# Patient Record
Sex: Female | Born: 1990 | Race: Black or African American | Hispanic: No | Marital: Married | Smoking: Never smoker
Health system: Southern US, Community
[De-identification: ages and names within clinical notes are randomized; demographics above are authoritative.]

## PROBLEM LIST (undated history)

## (undated) ENCOUNTER — Inpatient Hospital Stay (HOSPITAL_COMMUNITY): Payer: Self-pay

## (undated) DIAGNOSIS — R519 Headache, unspecified: Secondary | ICD-10-CM

## (undated) DIAGNOSIS — R51 Headache: Secondary | ICD-10-CM

## (undated) HISTORY — DX: Headache: R51

## (undated) HISTORY — DX: Headache, unspecified: R51.9

---
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
## 2010-03-23 ENCOUNTER — Inpatient Hospital Stay (HOSPITAL_COMMUNITY)
Admission: AD | Admit: 2010-03-23 | Discharge: 2010-03-26 | DRG: 765 | Disposition: A | Discharge status: Home / Self Care | Payer: Commercial Indemnity | Source: Ambulatory Visit | Attending: Obstetrics and Gynecology | Admitting: Obstetrics and Gynecology

## 2010-03-23 ENCOUNTER — Other Ambulatory Visit (HOSPITAL_COMMUNITY): Payer: Self-pay | Admitting: Obstetrics and Gynecology

## 2010-03-23 DIAGNOSIS — O411090 Infection of amniotic sac and membranes, unspecified, unspecified trimester, not applicable or unspecified: Secondary | ICD-10-CM | POA: Diagnosis present

## 2010-03-23 DIAGNOSIS — O324XX0 Maternal care for high head at term, not applicable or unspecified: Secondary | ICD-10-CM | POA: Diagnosis present

## 2010-03-23 LAB — CBC
MCH: 29.6 pg (ref 26.0–34.0)
MCHC: 34.6 g/dL (ref 30.0–36.0)
MCV: 85.7 fL (ref 78.0–100.0)
Platelets: 220 10*3/uL (ref 150–400)
RDW: 14 % (ref 11.5–15.5)

## 2010-03-23 LAB — RPR: RPR Ser Ql: NONREACTIVE

## 2010-03-24 LAB — CBC
Hemoglobin: 9.3 g/dL — ABNORMAL LOW (ref 12.0–15.0)
MCH: 29.6 pg (ref 26.0–34.0)
MCHC: 33.3 g/dL (ref 30.0–36.0)
Platelets: 176 10*3/uL (ref 150–400)
RDW: 14.3 % (ref 11.5–15.5)

## 2010-03-28 ENCOUNTER — Inpatient Hospital Stay (HOSPITAL_COMMUNITY)
Admission: AD | Admit: 2010-03-28 | Discharge: 2010-03-28 | Disposition: A | Discharge status: Home / Self Care | Payer: Commercial Indemnity | Source: Ambulatory Visit | Attending: Obstetrics and Gynecology | Admitting: Obstetrics and Gynecology

## 2010-03-28 DIAGNOSIS — O9122 Nonpurulent mastitis associated with the puerperium: Secondary | ICD-10-CM | POA: Insufficient documentation

## 2010-03-28 DIAGNOSIS — O864 Pyrexia of unknown origin following delivery: Secondary | ICD-10-CM | POA: Insufficient documentation

## 2010-03-28 LAB — URINALYSIS, ROUTINE W REFLEX MICROSCOPIC
Ketones, ur: NEGATIVE mg/dL
Protein, ur: 30 mg/dL — AB
Urine Glucose, Fasting: NEGATIVE mg/dL
Urobilinogen, UA: 0.2 mg/dL (ref 0.0–1.0)

## 2010-03-28 LAB — URINE MICROSCOPIC-ADD ON

## 2010-03-31 LAB — URINE CULTURE
Colony Count: >=100000
Culture  Setup Time: 201202070450

## 2010-04-03 NOTE — Op Note (Signed)
[name], [name]               ACCOUNT NO.:  [id number]  MEDICAL RECORD NO.:  [id number]           PATIENT TYPE:  I  LOCATION:  9138                          FACILITY:  [facility]  PHYSICIAN:  Zelphia Cairo, MD    DATE OF BIRTH:  November 07, 1990  DATE OF PROCEDURE:  03/23/2010 DATE OF DISCHARGE:                              OPERATIVE REPORT   PREOPERATIVE DIAGNOSES: 1. Intrauterine pregnancy at 39 plus weeks. 2. Chorioamnionitis. 3. Failure to descent.  PROCEDURE:  Primary low transverse cesarean delivery.  SURGEON:  Zelphia Cairo, MD  ANESTHESIA:  Epidural.  FINDINGS:  Viable female infant with Apgars of 6 and 9, pH 7.249.  SPECIMEN:  Placenta to Pathology.  URINE OUTPUT:  Clear.  ESTIMATED BLOOD LOSS:  700 mL.  COMPLICATIONS:  None.  CONDITION:  Stable to recovery room.  PROCEDURE IN DETAILS:  Tanya [name] was taken to the operating room with Foley catheter running and placed in the supine position with a left tilt.  Fetal heart tones were noted to be 180.  However, during dosing of her epidural and prepping for surgery, fetal heart tones decreased to the 80s.  At this point she was quickly draped.  An Allis test was negative and a Pfannenstiel skin incision was made with a scalpel and carried down to the underlying fascia.  The fascia was incised in the midline and this was extended laterally using curved Mayo scissors. Kocher clamps were used to grasp the superior and inferior portion of the fascia.  The fascia was tented upwards and the underlying rectus muscles were dissected off using curved Mayo scissors.  Peritoneum was entered sharply and this was extended bluntly.  The bladder blade was inserted and the vesicouterine peritoneum was dissected off the lower uterine segment using blunt dissection.  The bladder blade was repositioned.  Uterine incision was made with a scalpel and extended bluntly using my hands.  The head was deep posterior and extended within the  pelvis.  At this point I asked for a vaginal hand and a step stool.  A very little help was given with the vaginal hand by a nursing student and therefore Dr. Tamela Oddi was called into the room to assist with delivery.  When she arrived, I went below the table, used a cupped palm to gently push the baby's head up into the pelvis and she was able to flex the head and deliver through the uterine incision.  The shoulders and body easily followed with fundal pressure.  The cord was clamped and cut as the mouth and nose were suctioned and the infant was quickly taken to the awaiting pediatric staff.  The placenta was then manually removed from the uterus.  The uterus was cleared of all clots and debris using a dry lap sponge and the uterine incision was closed using double-layer closure of 0 chromic in a running locked fashion.  Once hemostasis was assured, the pelvis was copiously irrigated with warm normal saline.  Uterine incision was reinspected and again found to be hemostatic.  The peritoneum was closed with 0 Monocryl.  The fascia was closed with a looped 0 PDS  and the skin was closed with staples.  Sponge, lap, instrument and needle counts were correct x2.  She was taken to the recovery room in stable condition.     Zelphia Cairo, MD     GA/MEDQ  D:  03/23/2010  T:  03/24/2010  Job:  [id number]  Electronically Signed by Zelphia Cairo MD on 03/31/2010 01:28:10 PM

## 2010-04-13 NOTE — Discharge Summary (Signed)
Tanya [name], PERDUE NO.:  [id number]  MEDICAL RECORD NO.:  [id number]           PATIENT TYPE:  I  LOCATION:  9138                          [facility]ACILITY:  [facility]  PHYSICIAN:  Juluis Mire, M.D.   DATE OF BIRTH:  Apr 12, 1990  DATE OF ADMISSION:  03/23/2010 DATE OF DISCHARGE:  03/26/2010                              DISCHARGE SUMMARY   ADMITTING DIAGNOSES: 1. Intrauterine pregnancy at 39-2/7 weeks' estimated gestational age. 2. Spontaneous onset of labor.  DISCHARGE DIAGNOSES: 1. Status post low transverse cesarean section secondary to failure to     descent. 2. Viable female infant.  PROCEDURE:  Primary low transverse cesarean section.  REASON FOR ADMISSION:  Please see written H and P.  HOSPITAL COURSE:  The patient is a 20 year old gravida 1, para 0 that was admitted to [facility] at 39-2/7 weeks' estimated gestational age with spontaneous onset of labor.  On admission, vital signs were stable.  Fetal heart tones were reactive.  Contractions were noted to be approximately every 2-8 minutes.  The patient was examined and found to be 4-5 cm dilated, 70% effaced, vertex at -1 station. Epidural was placed for her comfort.  Over the next several hours, vital signs were stable.  However, later in the evening, temperature was 102. Fetal heart tones were 160-170s with good beat-to-beat variability.  The patient's cervix was examined and they completely dilated, 100% effaced at a +1 station.  The patient was started on Tylenol, ampicillin, and gentamicin.  Over the next several hours, the patient continued to push with little gain in station.  Caput was noted to be forming.  Decision was made to proceed with a primary low transverse cesarean section secondary to failure to descent and chorioamnionitis.  The patient was then transferred to the operating room where epidural was dosed to an adequate surgical level.  A low transverse incision was made  with delivery of a viable female infant, weighing 7 pounds 15 ounces with Apgars of 9 at 1 minute and 9 at 5 minutes.  The patient tolerated the procedure well and was taken to the recovery room in stable condition. On postoperative day #1, the patient was without complaint.  Vital signs were stable.  Fundus was firm and nontender.  Abdominal dressing was noted to be clean, dry, and intact.  On postoperative day #2, the patient was without complaint.  Vital signs were stable.  She has been afebrile greater than 24 hours.  Abdomen was soft.  Fundus was firm and nontender.  Abdominal dressing was noted to have small amount of old drainage noted on the bandage.  She was voiding well.  Laboratory findings showed hemoglobin of 9.3 and WBC count was 14.1.  IV antibiotics were discontinued and the patient was changed to oral Ceftin 500 mg 1 p.o. b.i.d. and CBC was ordered for the following morning.  On postoperative day #3, the patient was without complaint.  Vital signs were stable.  Fundus was firm and nontender.  Incision was clean, dry, and intact.  Discharge instructions were reviewed and the patient was later discharged home.  CONDITION ON  DISCHARGE:  Stable.  DIET:  Regular as tolerated.  ACTIVITY:  No heavy lifting, no driving x2 weeks, and no vaginal entry.  FOLLOWUP:  The patient is to follow up in the office in 1 week for an incision check.  She is to call for temperature greater than 100 degrees, persistent nausea, vomiting, heavy vaginal bleeding, and/or redness or drainage from an incisional site.  DISCHARGE MEDICATIONS: 1. Tylox #30 one p.o. every 4-6 hours p.r.n. 2. Keflex 500 mg 1 p.o. q.i.d. 3. Prenatal vitamins 1 p.o. daily. 4. Colace 1 p.o. daily p.r.n.     Julio Sicks, N.P.   ______________________________ Juluis Mire, M.D.    CC/MEDQ  D:  04/04/2010  T:  04/05/2010  Job:  [id number]  Electronically Signed by Julio Sicks N.P. on 04/06/2010 08:46:20  AM Electronically Signed by Richardean Chimera M.D. on 04/13/2010 07:46:16 AM

## 2010-05-06 ENCOUNTER — Other Ambulatory Visit: Payer: Self-pay | Admitting: Obstetrics and Gynecology

## 2010-05-06 DIAGNOSIS — N6310 Unspecified lump in the right breast, unspecified quadrant: Secondary | ICD-10-CM

## 2010-05-10 ENCOUNTER — Ambulatory Visit
Admission: RE | Admit: 2010-05-10 | Discharge: 2010-05-10 | Disposition: A | Discharge status: Home / Self Care | Payer: Commercial Indemnity | Source: Ambulatory Visit | Attending: Obstetrics and Gynecology | Admitting: Obstetrics and Gynecology

## 2010-05-10 DIAGNOSIS — N6310 Unspecified lump in the right breast, unspecified quadrant: Secondary | ICD-10-CM

## 2010-06-21 ENCOUNTER — Inpatient Hospital Stay (HOSPITAL_COMMUNITY): Admission: AD | Admit: 2010-06-21 | Payer: Self-pay | Source: Home / Self Care | Admitting: Obstetrics and Gynecology

## 2014-06-09 ENCOUNTER — Other Ambulatory Visit: Payer: Self-pay | Admitting: Obstetrics and Gynecology

## 2014-06-10 LAB — CYTOLOGY - PAP

## 2014-08-19 ENCOUNTER — Emergency Department (HOSPITAL_COMMUNITY)
Admission: EM | Admit: 2014-08-19 | Discharge: 2014-08-19 | Disposition: A | Discharge status: Home / Self Care | Payer: BLUE CROSS/BLUE SHIELD | Source: Home / Self Care | Attending: Family Medicine | Admitting: Family Medicine

## 2014-08-19 ENCOUNTER — Encounter (HOSPITAL_COMMUNITY): Payer: Self-pay | Admitting: Emergency Medicine

## 2014-08-19 DIAGNOSIS — K047 Periapical abscess without sinus: Secondary | ICD-10-CM | POA: Diagnosis not present

## 2014-08-19 MED ORDER — TRAMADOL HCL 50 MG PO TABS
50.0000 mg | ORAL_TABLET | Freq: Four times a day (QID) | ORAL | Status: DC | PRN
Start: 2014-08-19 — End: 2015-07-26

## 2014-08-19 MED ORDER — AMOXICILLIN 500 MG PO CAPS: 500.0000 mg | ORAL_CAPSULE | Freq: Three times a day (TID) | ORAL | Status: DC

## 2014-08-19 MED ORDER — NAPROXEN 500 MG PO TABS: 500.0000 mg | ORAL_TABLET | Freq: Two times a day (BID) | ORAL | Status: DC

## 2014-08-19 NOTE — ED Provider Notes (Signed)
Tanya [name] is a 24 y.o. female who presents to Urgent Care today for left lower dental pain and swelling present for 2 days. No fevers chills nausea vomiting or diarrhea. She has tried Advil which has not helped. She has a dentist appointment on Friday. No chest pains palpitations or shortness of breath.   History reviewed. No pertinent past medical history. History reviewed. No pertinent past surgical history. History  Substance Use Topics  . Smoking status: Never Smoker   . Smokeless tobacco: Not on file  . Alcohol Use: Yes   ROS as above Medications: No current facility-administered medications for this encounter.   Current Outpatient Prescriptions  Medication Sig Dispense Refill  . amoxicillin (AMOXIL) 500 MG capsule Take 1 capsule (500 mg total) by mouth 3 (three) times daily. 21 capsule 0  . naproxen (NAPROSYN) 500 MG tablet Take 1 tablet (500 mg total) by mouth 2 (two) times daily. 30 tablet 0  . traMADol (ULTRAM) 50 MG tablet Take 1 tablet (50 mg total) by mouth every 6 (six) hours as needed. 15 tablet 0   No Known Allergies   Exam:  BP 109/50 mmHg  Pulse 58  Temp(Src) 98.4 F (36.9 C) (Oral)  Resp 16  SpO2 100% Gen: Well NAD HEENT: EOMI,  MMM left lower jaw swelling and tenderness. Missing tooth #18 with gum erythematous and tender. Tooth 17 is partially emerging and also tender. No visible abscess. Lungs: Normal work of breathing. CTABL Heart: RRR no MRG Abd: NABS, Soft. Nondistended, Nontender Exts: Brisk capillary refill, warm and well perfused.   No results found for this or any previous visit (from the past 24 hour(s)). No results found.  Assessment and Plan: 24 y.o. female with dental infection and pain. Treat with amoxicillin naproxen and tramadol. Follow-up with dentist.  Discussed warning signs or symptoms. Please see discharge instructions. Patient expresses understanding.     Rodolph BongEvan S Tambra Muller, MD 08/19/14 [phone]

## 2014-08-19 NOTE — ED Notes (Signed)
C/o left lower dental pain onset Monday Alert, no signs of acute distress.

## 2014-08-19 NOTE — Discharge Instructions (Signed)
Thank you for coming in today. Follow up with the dentist.  Naproxen will help with pain and will not make you sleepy.  Use tramadol for severe pain. 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 Take amoxicillin 3 times daily to treat infection.   Dental Abscess A dental abscess is a collection of infected fluid (pus) from a bacterial infection in the inner part of the tooth (pulp). It usually occurs at the end of the tooth's root.  CAUSES   Severe tooth decay.  Trauma to the tooth that allows bacteria to enter into the pulp, such as a broken or chipped tooth. SYMPTOMS   Severe pain in and around the infected tooth.  Swelling and redness around the abscessed tooth or in the mouth or face.  Tenderness.  Pus drainage.  Bad breath.  Bitter taste in the mouth.  Difficulty swallowing.  Difficulty opening the mouth.  Nausea.  Vomiting.  Chills.  Swollen neck glands. DIAGNOSIS   A medical and dental history will be taken.  An examination will be performed by tapping on the abscessed tooth.  X-rays may be taken of the tooth to identify the abscess. TREATMENT The goal of treatment is to eliminate the infection. You may be prescribed antibiotic medicine to stop the infection from spreading. A root canal may be performed to save the tooth. If the tooth cannot be saved, it may be pulled (extracted) and the abscess may be drained.  HOME CARE INSTRUCTIONS  Only take over-the-counter or prescription medicines for pain, fever, or discomfort as directed by your caregiver.  Rinse your mouth (gargle) often with salt water ( tsp salt in 8 oz [250 ml] of warm water) to relieve pain or swelling.  Do not drive after taking pain medicine (narcotics).  Do not apply heat to the outside of your face.  Return to your dentist for further treatment as directed. SEEK MEDICAL CARE IF:  Your pain is not helped by medicine.  Your pain is getting worse instead  of better. SEEK IMMEDIATE MEDICAL CARE IF:  You have a fever or persistent symptoms for more than 2-3 days.  You have a fever and your symptoms suddenly get worse.  You have chills or a very bad headache.  You have problems breathing or swallowing.  You have trouble opening your mouth.  You have swelling in the neck or around the eye. Document Released: 02/06/2005 Document Revised: 11/01/2011 Document Reviewed: 05/17/2010 [facility] Patient Information 2015 [employer], [place]. This information is not intended to replace advice given to you by your health care provider. Make sure you discuss any questions you have with your health care provider.

## 2015-02-21 NOTE — L&D Delivery Note (Addendum)
Delivery Note At 9:53 AM a viable female was delivered via VBAC, Spontaneous (Presentation: Right Occiput Anterior).  APGAR: 9, 9; weight pending.   Placenta status: Intact, Spontaneous.  Cord: 3 vessels with the following complications: None.  Cord pH: n/a  Anesthesia: Epidural  Episiotomy: None Lacerations: Periurethral Suture Repair: vicryl rapide 4.0 for hemostasis Est. Blood Loss (mL):    Mom to postpartum.  Baby to Couplet care / Skin to Skin.  Donette LarryMelanie Nevah Dalal, CNM 08/14/2015, 10:19 AM

## 2015-05-14 ENCOUNTER — Encounter: Payer: Self-pay | Admitting: *Deleted

## 2015-05-31 ENCOUNTER — Encounter: Payer: BLUE CROSS/BLUE SHIELD | Admitting: Family Medicine

## 2015-06-17 ENCOUNTER — Encounter: Payer: Self-pay | Admitting: Family Medicine

## 2015-06-17 ENCOUNTER — Ambulatory Visit (INDEPENDENT_AMBULATORY_CARE_PROVIDER_SITE_OTHER): Payer: Medicaid Other | Admitting: Family Medicine

## 2015-06-17 VITALS — BP 98/63 | HR 87 | Temp 98.4°F | Ht 61.0 in | Wt 227.5 lb

## 2015-06-17 DIAGNOSIS — O0933 Supervision of pregnancy with insufficient antenatal care, third trimester: Secondary | ICD-10-CM | POA: Diagnosis not present

## 2015-06-17 DIAGNOSIS — O0990 Supervision of high risk pregnancy, unspecified, unspecified trimester: Secondary | ICD-10-CM | POA: Insufficient documentation

## 2015-06-17 DIAGNOSIS — Z23 Encounter for immunization: Secondary | ICD-10-CM | POA: Diagnosis not present

## 2015-06-17 DIAGNOSIS — O0993 Supervision of high risk pregnancy, unspecified, third trimester: Secondary | ICD-10-CM

## 2015-06-17 DIAGNOSIS — O34219 Maternal care for unspecified type scar from previous cesarean delivery: Secondary | ICD-10-CM | POA: Insufficient documentation

## 2015-06-17 DIAGNOSIS — Z113 Encounter for screening for infections with a predominantly sexual mode of transmission: Secondary | ICD-10-CM

## 2015-06-17 LAB — POCT URINALYSIS DIP (DEVICE)
Bilirubin Urine: NEGATIVE
Glucose, UA: NEGATIVE mg/dL
Ketones, ur: NEGATIVE mg/dL
NITRITE: NEGATIVE
PH: 6 (ref 5.0–8.0)
PROTEIN: 30 mg/dL — AB
Specific Gravity, Urine: >=1.03 (ref 1.005–1.030)
UROBILINOGEN UA: 1 mg/dL (ref 0.0–1.0)

## 2015-06-17 MED ORDER — TETANUS-DIPHTH-ACELL PERTUSSIS 5-2.5-18.5 LF-MCG/0.5 IM SUSP
0.5000 mL | Freq: Once | INTRAMUSCULAR | Status: AC
  Administered 2015-06-17: 0.5 mL via INTRAMUSCULAR

## 2015-06-17 NOTE — Patient Instructions (Signed)

## 2015-06-17 NOTE — Progress Notes (Signed)
   Subjective:    Tanya [name] is a G2P1001 5419w4d being seen today for her first obstetrical visit.  Her obstetrical history is significant for Previous cesarean section for nonreassuring FHT during pushing, insufficient prenatal care. Patient does intend to breast feed. Pregnancy history fully reviewed.  Patient reports no complaints.  Filed Vitals:   06/17/15 0817 06/17/15 0820  BP: 98/63   Pulse: 87   Temp: 98.4 F (36.9 C)   Height:  5\' 1"  (1.549 m)  Weight: 227 lb 8 oz (103.193 kg)     HISTORY: OB History  Gravida Para Term Preterm AB SAB TAB Ectopic Multiple Living  2 1 1       1     # Outcome Date GA Lbr Len/2nd Weight Sex Delivery Anes PTL Lv  2 Current           1 Term 03/23/10 [redacted]w[redacted]d  7 lb 15 oz (3.6 kg) M CS-Unspec EPI N Y     Complications: Fetal Intolerance     Past Medical History  Diagnosis Date  . Headache    Past Surgical History  Procedure Laterality Date  . Cesarean section     Family History  Problem Relation Age of Onset  . Hypertension Maternal Grandmother      Exam    Uterus:     System:     Skin: normal coloration and turgor, no rashes    Neurologic: gait normal; reflexes normal and symmetric   Extremities: normal strength, tone, and muscle mass   HEENT PERRLA and extra ocular movement intact   Mouth/Teeth mucous membranes moist, pharynx normal without lesions   Neck supple and no masses   Cardiovascular: regular rate and rhythm, no murmurs or gallops   Respiratory:  appears well, vitals normal, no respiratory distress, acyanotic, normal RR, ear and throat exam is normal, neck free of mass or lymphadenopathy, chest clear, no wheezing, crepitations, rhonchi, normal symmetric air entry   Abdomen: soft, non-tender; bowel sounds normal; no masses,  no organomegaly          Assessment:    Pregnancy: G2P1001 Patient Active Problem List   Diagnosis Date Noted  . Supervision of high risk pregnancy, antepartum 06/17/2015  . Insufficient  prenatal care in third trimester 06/17/2015  . Previous cesarean delivery, delivered 06/17/2015        Plan:      1. Need for Tdap vaccination  - Tdap (BOOSTRIX) injection 0.5 mL; Inject 0.5 mLs into the muscle once.  2. Supervision of high risk pregnancy, antepartum, third trimester  - Prenatal Profile - GC/Chlamydia probe amp ([place])[facility] - Hemoglobinopathy evaluation - Prescript Monitor Profile(19) - Culture, OB Urine - US MFM OB COMP + 14 WK; Future Prenatal vitamins. Problem list reviewed and updated.  3.  Insufficient PNC 4.  Prior C/s  - 2 layer closure, went into labor on own  - Discussed TOLAC vs RLTCS.  Wishes TOLAC.  Consent signed.  Tanya CelesteSTINSON, Tanya [name] Tanya [name] 06/17/2015

## 2015-06-17 NOTE — Progress Notes (Signed)
Initial prenatal info packet given Breastfeeding tip of the week reviewed Tdap today Initial prenatal labs/1 hr gtt today

## 2015-06-18 LAB — PRENATAL PROFILE (SOLSTAS)
Antibody Screen: NEGATIVE
BASOS PCT: 0 %
Basophils Absolute: 0 cells/uL (ref 0–200)
Eosinophils Absolute: 267 cells/uL (ref 15–500)
Eosinophils Relative: 3 %
HEMATOCRIT: 35.4 % (ref 35.0–45.0)
HEP B S AG: NEGATIVE
HIV: NONREACTIVE
Hemoglobin: 12.2 g/dL (ref 11.7–15.5)
LYMPHS ABS: 1602 {cells}/uL (ref 850–3900)
Lymphocytes Relative: 18 %
MCH: 31.8 pg (ref 27.0–33.0)
MCHC: 34.5 g/dL (ref 32.0–36.0)
MCV: 92.2 fL (ref 80.0–100.0)
MONO ABS: 623 {cells}/uL (ref 200–950)
MPV: 10.5 fL (ref 7.5–12.5)
Monocytes Relative: 7 %
Neutro Abs: 6408 cells/uL (ref 1500–7800)
Neutrophils Relative %: 72 %
Platelets: 248 10*3/uL (ref 140–400)
RBC: 3.84 MIL/uL (ref 3.80–5.10)
RDW: 13.3 % (ref 11.0–15.0)
RH TYPE: POSITIVE
Rubella: 1.78 Index — ABNORMAL HIGH (ref <0.90)
WBC: 8.9 10*3/uL (ref 3.8–10.8)

## 2015-06-18 LAB — PRESCRIPTION MONITORING PROFILE (19 PANEL)
Amphetamine/Meth: NEGATIVE ng/mL
BENZODIAZEPINE SCREEN, URINE: NEGATIVE ng/mL
Barbiturate Screen, Urine: NEGATIVE ng/mL
Buprenorphine, Urine: NEGATIVE ng/mL
CARISOPRODOL, URINE: NEGATIVE ng/mL
COCAINE METABOLITES: NEGATIVE ng/mL
CREATININE, URINE: 345.63 mg/dL (ref >20.0)
Cannabinoid Scrn, Ur: NEGATIVE ng/mL
ECSTASY: NEGATIVE ng/mL
FENTANYL URINE: NEGATIVE ng/mL
Meperidine, Ur: NEGATIVE ng/mL
Methadone Screen, Urine: NEGATIVE ng/mL
Methaqualone: NEGATIVE ng/mL
NITRITES URINE, INITIAL: NEGATIVE ug/mL
OPIATE SCREEN, URINE: NEGATIVE ng/mL
OXYCODONE SCRN UR: NEGATIVE ng/mL
PH URINE, INITIAL: 6.1 pH (ref 4.5–8.9)
PROPOXYPHENE: NEGATIVE ng/mL
Phencyclidine, Ur: NEGATIVE ng/mL
TRAMADOL UR: NEGATIVE ng/mL
Tapentadol, urine: NEGATIVE ng/mL
ZOLPIDEM, URINE: NEGATIVE ng/mL

## 2015-06-18 LAB — CULTURE, OB URINE

## 2015-06-18 LAB — GC/CHLAMYDIA PROBE AMP (~~LOC~~) NOT AT ARMC
Chlamydia: NEGATIVE
Neisseria Gonorrhea: NEGATIVE

## 2015-06-18 LAB — GLUCOSE TOLERANCE, 1 HOUR (50G) W/O FASTING: GLUCOSE, 1 HR, GESTATIONAL: 98 mg/dL (ref <140)

## 2015-06-21 ENCOUNTER — Ambulatory Visit (HOSPITAL_COMMUNITY)
Admission: RE | Admit: 2015-06-21 | Discharge: 2015-06-21 | Disposition: A | Discharge status: Home / Self Care | Payer: Medicaid Other | Source: Ambulatory Visit | Attending: Family Medicine | Admitting: Family Medicine

## 2015-06-21 ENCOUNTER — Other Ambulatory Visit: Payer: Self-pay | Admitting: Family Medicine

## 2015-06-21 DIAGNOSIS — IMO0001 Reserved for inherently not codable concepts without codable children: Secondary | ICD-10-CM

## 2015-06-21 DIAGNOSIS — O0993 Supervision of high risk pregnancy, unspecified, third trimester: Secondary | ICD-10-CM | POA: Diagnosis present

## 2015-06-21 DIAGNOSIS — O0933 Supervision of pregnancy with insufficient antenatal care, third trimester: Secondary | ICD-10-CM | POA: Insufficient documentation

## 2015-06-21 DIAGNOSIS — Z36 Encounter for antenatal screening of mother: Secondary | ICD-10-CM | POA: Insufficient documentation

## 2015-06-21 DIAGNOSIS — Z3A31 31 weeks gestation of pregnancy: Secondary | ICD-10-CM | POA: Insufficient documentation

## 2015-06-21 IMAGING — US US MFM OB COMP +14 WKS
1 series · 14 of 28 positions shown · non-contrast
Comparison: none

[Series 1: us mfm ob comp +14 wks · 52 acquisitions, 14 frames shown]
[im 2/52]
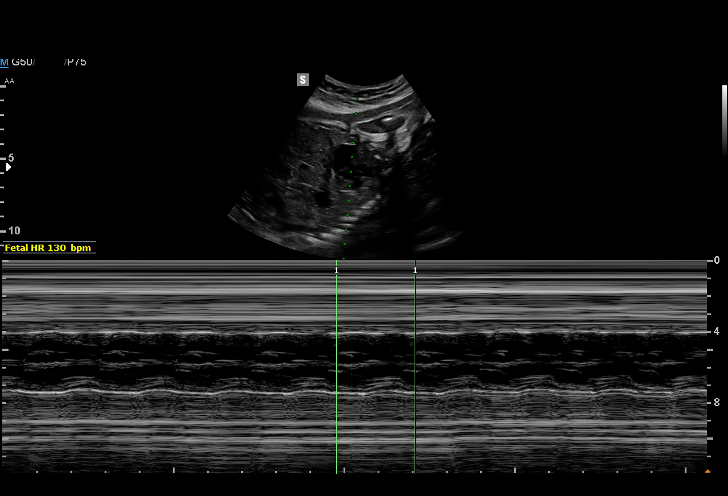
[im 6/52]
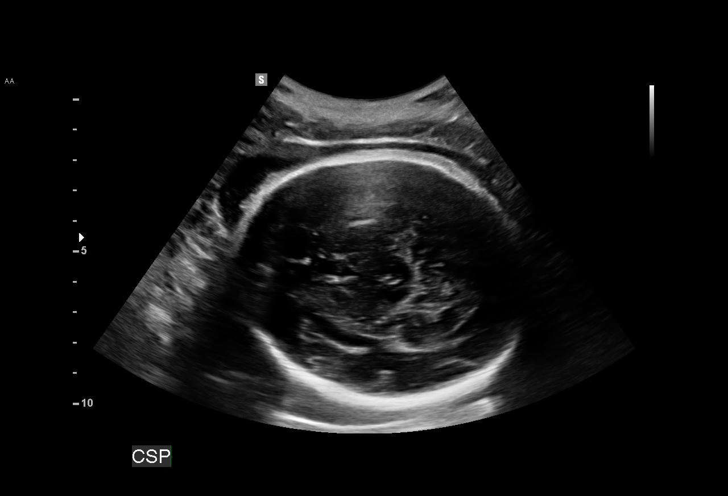
[im 10/52]
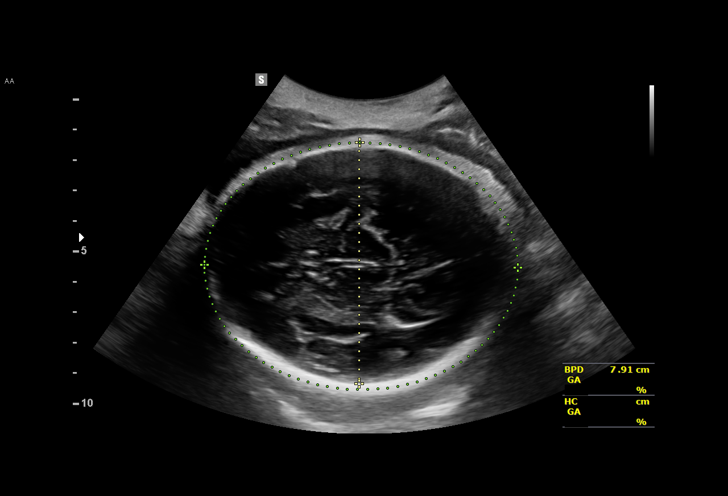
[im 14/52]
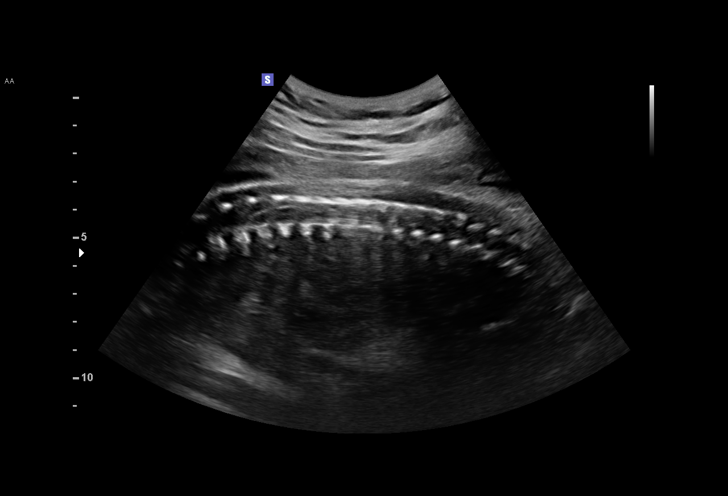
[im 18/52]
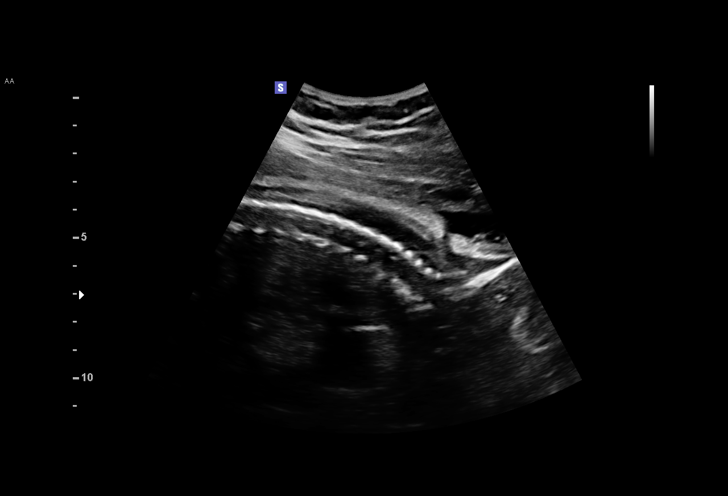
[im 21/52]
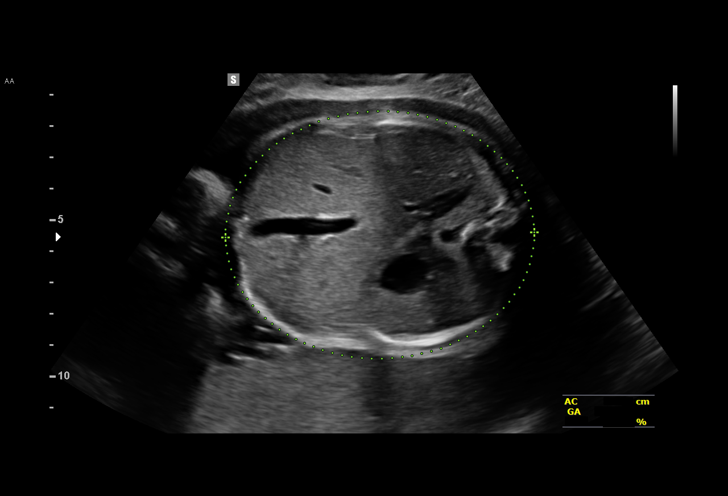
[im 25/52]
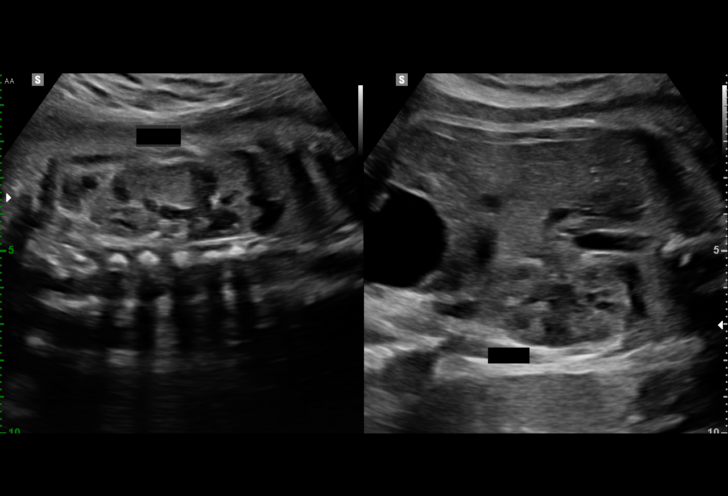
[im 29/52]
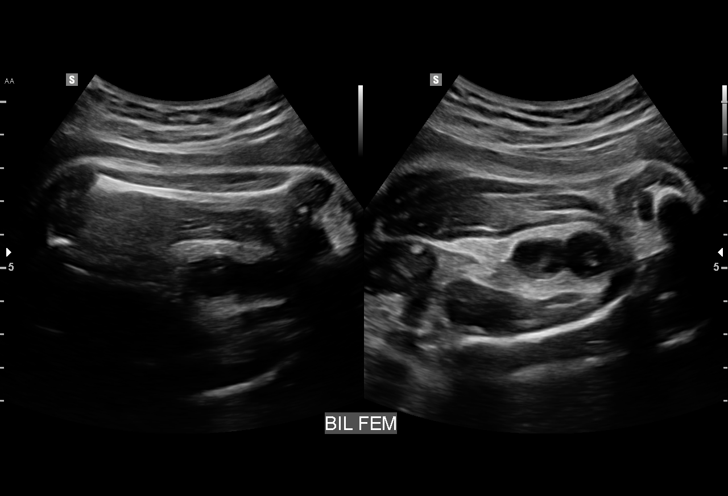
[im 33/52]
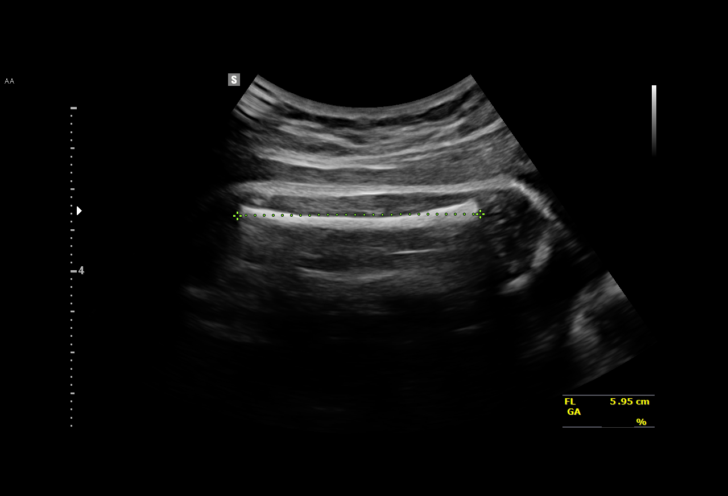
[im 36/52]
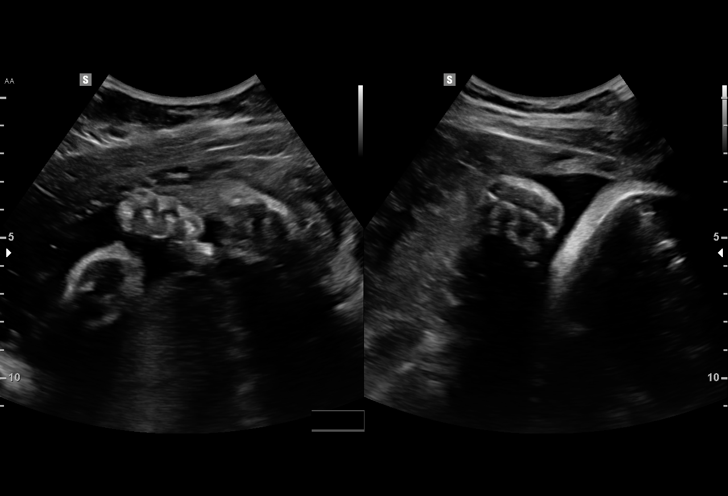
[im 40/52]
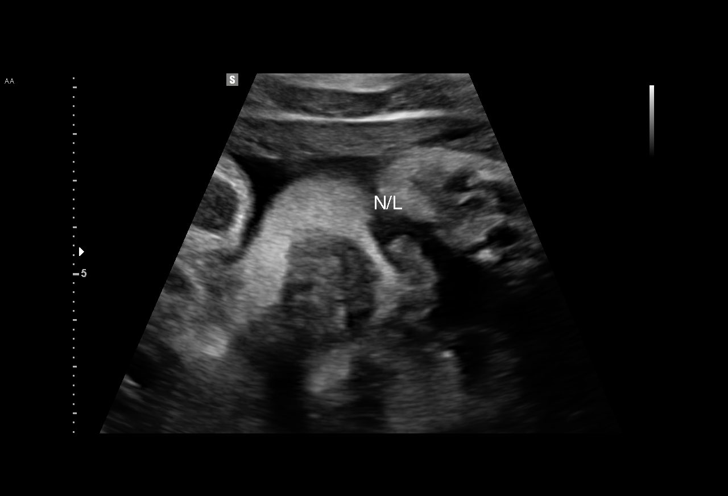
[im 44/52]
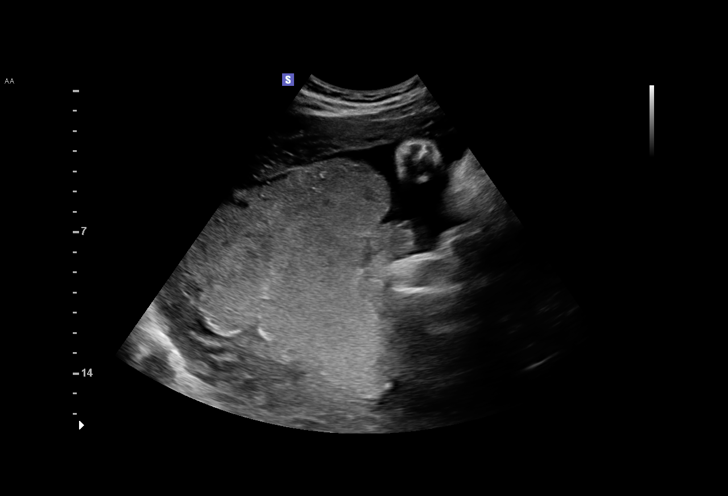
[im 48/52]
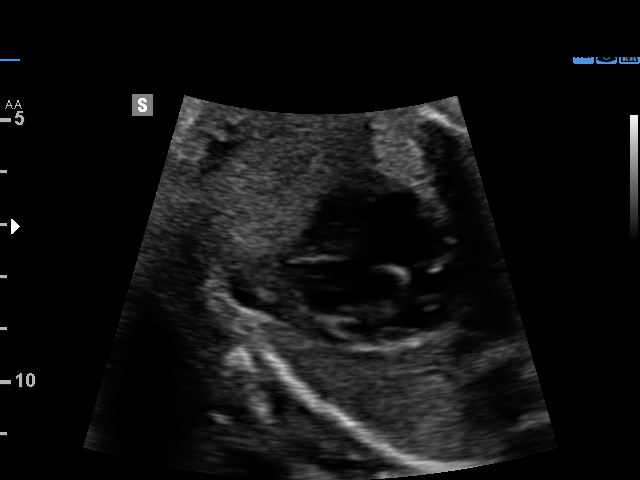
[im 52/52]
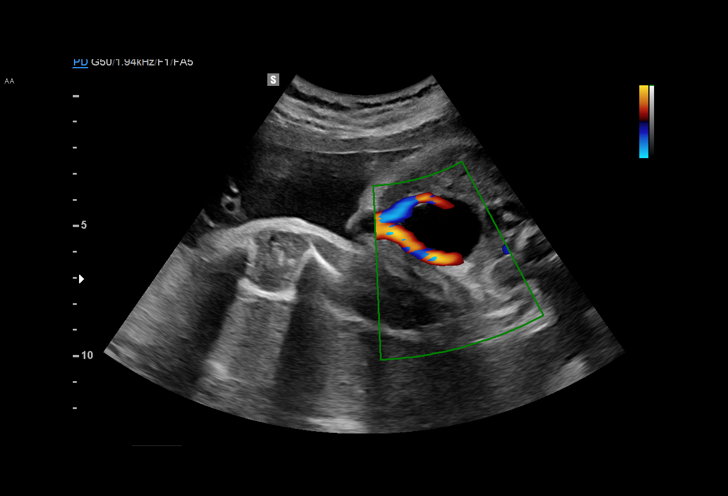

[14 of 28 positions shown; findings below may reference images not displayed]

OB/Gyn Clinic
[REDACTED]-
Faculty Physician

Indications

31 weeks gestation of pregnancy
Basic anatomic survey                          Z36
Late to prenatal care, third trimester         [04]
OB History

Gravidity:    2         Term:   1
Living:       1
Fetal Evaluation

Num Of Fetuses:     1
Fetal Heart         130
Rate(bpm):
Cardiac Activity:   Observed
Presentation:       Cephalic
Placenta:           Posterior, above cervical os

Amniotic Fluid
AFI FV:      Subjectively within normal limits

AFI Sum(cm)     %Tile       Largest Pocket(cm)
12.9            38

RUQ(cm)       RLQ(cm)       LUQ(cm)        LLQ(cm)
2.48
Biometry
BPD:      78.7  mm     G. Age:  31w 4d         53  %    CI:        74.87   %    70 - 86
FL/HC:      20.6   %    19.3 -
HC:      288.6  mm     G. Age:  31w 5d         30  %    HC/AC:      1.03        0.96 -
AC:      280.5  mm     G. Age:  32w 0d         75  %    FL/BPD:     75.6   %    71 - 87
FL:       59.5  mm     G. Age:  31w 0d         33  %    FL/AC:      21.2   %    20 - 24

Est. FW:    [04]  gm           4 lb     66  %
Gestational Age

LMP:           31w 1d        Date:  [DATE]                 EDD:   [DATE]
U/S Today:     31w 4d                                        EDD:   [DATE]
Best:          31w 1d     Det. By:  LMP  ([DATE])          EDD:   [DATE]
Anatomy

Cranium:               Appears normal         Aortic Arch:            Not well visualized
Cavum:                 Appears normal         Ductal Arch:            Not well visualized
Ventricles:            Appears normal         Diaphragm:              Appears normal
Choroid Plexus:        Not well visualized    Stomach:                Appears normal, left
sided
Cerebellum:            Appears normal         Abdomen:                Appears normal
Posterior Fossa:       Not well visualized    Abdominal Wall:         Not well visualized
Nuchal Fold:           Not applicable (>20    Cord Vessels:           Not well visualized
wks GA)
Face:                  Appears normal         Kidneys:                Appear normal
(orbits and profile)
Lips:                  Appears normal         Bladder:                Appears normal
Heart:                 Not well visualized    Spine:                  Appears normal
RVOT:                  Not well visualized    Upper Extremities:      Appears normal
LVOT:                  Not well visualized    Lower Extremities:      Appears normal
Impression

Singleton intrauterine pregnancy at 31 weeks 1 day gestation
with fetal cardiac activity
Cephalic presentation
Posterior placenta without evidence of previa
Normal appearing fetal growth and amniotic fluid
No apparent birth defects but multiple structures not well
visualized on fetal anatomic survey secondary to fetal
position and advanced gestational age
Recommendations

Follow-up ultrasounds as clinically indicated or if patient and
provider desire further evaluation of fetal anatomy

## 2015-06-22 LAB — HEMOGLOBINOPATHY EVALUATION
HEMOGLOBIN OTHER: 0 %
HGB A: 96.7 % — AB (ref 96.8–97.8)
HGB S QUANTITAION: 0 %
Hgb A2 Quant: 3 % (ref 2.2–3.2)
Hgb F Quant: 0.3 % (ref 0.0–2.0)

## 2015-06-30 ENCOUNTER — Ambulatory Visit (INDEPENDENT_AMBULATORY_CARE_PROVIDER_SITE_OTHER): Payer: Self-pay | Admitting: Advanced Practice Midwife

## 2015-06-30 ENCOUNTER — Encounter: Payer: Self-pay | Admitting: Advanced Practice Midwife

## 2015-06-30 VITALS — BP 108/62 | HR 87 | Wt 231.9 lb

## 2015-06-30 DIAGNOSIS — O0993 Supervision of high risk pregnancy, unspecified, third trimester: Secondary | ICD-10-CM

## 2015-06-30 DIAGNOSIS — O0933 Supervision of pregnancy with insufficient antenatal care, third trimester: Secondary | ICD-10-CM

## 2015-06-30 LAB — POCT URINALYSIS DIP (DEVICE)
Glucose, UA: NEGATIVE mg/dL
NITRITE: NEGATIVE
Protein, ur: 100 mg/dL — AB
Specific Gravity, Urine: 1.025 (ref 1.005–1.030)
UROBILINOGEN UA: 1 mg/dL (ref 0.0–1.0)
pH: 6.5 (ref 5.0–8.0)

## 2015-06-30 NOTE — Progress Notes (Signed)
Subjective:  Tanya [name] is a 25 y.o. G2P1001 at 3076w3d being seen today for ongoing prenatal care.  She is currently monitored for the following issues for this low-risk pregnancy and has Supervision of high risk pregnancy, antepartum; Insufficient prenatal care in third trimester; and Previous cesarean delivery, delivered on her problem list.  Patient reports no complaints.   .  .  Movement: Present. Denies leaking of fluid. Some pain by shoulder blade.  The following portions of the patient's history were reviewed and updated as appropriate: allergies, current medications, past family history, past medical history, past social history, past surgical history and problem list. Problem list updated.  Objective:   Filed Vitals:   06/30/15 0800  BP: 108/62  Pulse: 87  Weight: 231 lb 14.4 oz (105.189 kg)    Fetal Status: Fetal Heart Rate (bpm): 128   Movement: Present     General:  Alert, oriented and cooperative. Patient is in no acute distress.  Skin: Skin is warm and dry. No rash noted.   Cardiovascular: Normal heart rate noted  Respiratory: Normal respiratory effort, no problems with respiration noted  Abdomen: Soft, gravid, appropriate for gestational age. Pain/Pressure: Present     Pelvic:       Cervical exam deferred        Extremities: Normal range of motion.     Mental Status: Normal mood and affect. Normal behavior. Normal judgment and thought content.   Urinalysis:      Assessment and Plan:  Pregnancy: G2P1001 at 7276w3d  1. Supervision of high risk pregnancy, antepartum, third trimester      Doing OK working.   Preterm labor symptoms and general obstetric precautions including but not limited to vaginal bleeding, contractions, leaking of fluid and fetal movement were reviewed in detail with the patient. Please refer to After Visit Summary for other counseling recommendations.  Return in about 2 weeks (around 07/14/2015) for Low Risk Clinic.   Aviva SignsMarie L Williams,  CNM

## 2015-06-30 NOTE — Patient Instructions (Signed)

## 2015-07-12 ENCOUNTER — Encounter (HOSPITAL_COMMUNITY): Payer: Self-pay

## 2015-07-12 ENCOUNTER — Inpatient Hospital Stay (HOSPITAL_COMMUNITY)
Admission: AD | Admit: 2015-07-12 | Discharge: 2015-07-12 | Disposition: A | Discharge status: Home / Self Care | Payer: Medicaid Other | Source: Ambulatory Visit | Attending: Family Medicine | Admitting: Family Medicine

## 2015-07-12 DIAGNOSIS — Z3A34 34 weeks gestation of pregnancy: Secondary | ICD-10-CM | POA: Diagnosis not present

## 2015-07-12 DIAGNOSIS — R51 Headache: Secondary | ICD-10-CM | POA: Diagnosis not present

## 2015-07-12 DIAGNOSIS — R609 Edema, unspecified: Secondary | ICD-10-CM | POA: Insufficient documentation

## 2015-07-12 DIAGNOSIS — O1203 Gestational edema, third trimester: Secondary | ICD-10-CM

## 2015-07-12 DIAGNOSIS — Z8249 Family history of ischemic heart disease and other diseases of the circulatory system: Secondary | ICD-10-CM | POA: Insufficient documentation

## 2015-07-12 LAB — URINALYSIS, ROUTINE W REFLEX MICROSCOPIC
Bilirubin Urine: NEGATIVE
Glucose, UA: NEGATIVE mg/dL
HGB URINE DIPSTICK: NEGATIVE
KETONES UR: 15 mg/dL — AB
Nitrite: NEGATIVE
PROTEIN: NEGATIVE mg/dL
Specific Gravity, Urine: 1.015 (ref 1.005–1.030)
pH: 6.5 (ref 5.0–8.0)

## 2015-07-12 LAB — URINE MICROSCOPIC-ADD ON: RBC / HPF: NONE SEEN RBC/hpf (ref 0–5)

## 2015-07-12 NOTE — MAU Provider Note (Signed)
History   G2P1001@ 34.1 wks inwith edema in lower extremities worse when at work.. Denies headache, dizziness, or blurred vision.  CSN: [id number]  Arrival date & time 07/12/15  1150   First Provider Initiated Contact with Patient 07/12/15 1313      Chief Complaint  Patient presents with  . Foot Swelling    HPI  Past Medical History  Diagnosis Date  . Headache     Past Surgical History  Procedure Laterality Date  . Cesarean section      Family History  Problem Relation Age of Onset  . Hypertension Maternal Grandmother     Social History  Substance Use Topics  . Smoking status: Never Smoker   . Smokeless tobacco: Never Used  . Alcohol Use: Yes     Comment: prior to finging out pregnancy    OB History    Gravida Para Term Preterm AB TAB SAB Ectopic Multiple Living   2 1 1       1       Review of Systems  Constitutional: Negative.   HENT: Negative.   Eyes: Negative.   Respiratory: Negative.   Cardiovascular: Negative.   Gastrointestinal: Negative.   Endocrine: Negative.   Genitourinary: Negative.   Musculoskeletal: Negative.   Skin: Negative.   Allergic/Immunologic: Negative.   Neurological: Negative.   Hematological: Negative.   Psychiatric/Behavioral: Negative.     Allergies  Review of patient's allergies indicates no known allergies.  Home Medications  No current outpatient prescriptions on file.  BP 107/59 mmHg  Pulse 95  Temp(Src) 98.2 F (36.8 C) (Oral)  Resp 16  Ht 5' 3.25" (1.607 m)  Wt 235 lb 8 oz (106.822 kg)  BMI 41.36 kg/m2  LMP 11/15/2014 (Approximate)  Physical Exam  Constitutional: She is oriented to person, place, and time. She appears well-developed and well-nourished.  HENT:  Head: Normocephalic.  Eyes: Pupils are equal, round, and reactive to light.  Neck: Normal range of motion.  Cardiovascular: Normal rate, regular rhythm, normal heart sounds and intact distal pulses.   Pulmonary/Chest: Effort normal and breath  sounds normal.  Abdominal: Soft. Bowel sounds are normal.  Musculoskeletal: She exhibits edema.  1+ edema lower extremities, DTR's 1+  Neurological: She is alert and oriented to person, place, and time. She has normal reflexes.  Skin: Skin is warm and dry.  Psychiatric: She has a normal mood and affect. Her behavior is normal. Judgment and thought content normal.    MAU Course  Procedures (including critical care time)  Labs Reviewed  URINALYSIS, ROUTINE W REFLEX MICROSCOPIC (NOT [facility]) - Abnormal; Notable for the following:    Ketones, ur 15 (*)    Leukocytes, UA TRACE (*)    All other components within normal limits  URINE MICROSCOPIC-ADD ON - Abnormal; Notable for the following:    Squamous Epithelial / LPF 6-30 (*)    Bacteria, UA FEW (*)    All other components within normal limits   No results found.   No diagnosis found.    MDM  Dx: edema of pregnancy Discussed decreasing sodium in diet and increasing water intake.d/c home

## 2015-07-12 NOTE — Discharge Instructions (Signed)

## 2015-07-12 NOTE — MAU Note (Signed)
Patient presents at [redacted] weeks gestation with c/o swollen, painful feet. Fetus active. Denies bleeding or discharge.

## 2015-07-20 ENCOUNTER — Ambulatory Visit (INDEPENDENT_AMBULATORY_CARE_PROVIDER_SITE_OTHER): Payer: Self-pay | Admitting: Family

## 2015-07-20 VITALS — BP 116/47 | HR 98 | Wt 235.6 lb

## 2015-07-20 DIAGNOSIS — Z3483 Encounter for supervision of other normal pregnancy, third trimester: Secondary | ICD-10-CM

## 2015-07-20 DIAGNOSIS — O34219 Maternal care for unspecified type scar from previous cesarean delivery: Secondary | ICD-10-CM

## 2015-07-20 DIAGNOSIS — O0993 Supervision of high risk pregnancy, unspecified, third trimester: Secondary | ICD-10-CM

## 2015-07-20 LAB — POCT URINALYSIS DIP (DEVICE)
Bilirubin Urine: NEGATIVE
GLUCOSE, UA: NEGATIVE mg/dL
Ketones, ur: NEGATIVE mg/dL
Nitrite: NEGATIVE
PROTEIN: NEGATIVE mg/dL
SPECIFIC GRAVITY, URINE: 1.02 (ref 1.005–1.030)
UROBILINOGEN UA: 2 mg/dL — AB (ref 0.0–1.0)
pH: 7.5 (ref 5.0–8.0)

## 2015-07-20 NOTE — Progress Notes (Signed)
Subjective:  Tanya [name] is a 25 y.o. G2P1001 at 3963w2d being seen today for ongoing prenatal care.  She is currently monitored for the following issues for this low-risk pregnancy and has Supervision of high risk pregnancy, antepartum; Insufficient prenatal care in third trimester; and Previous cesarean delivery, delivered on her problem list.  Patient reports no complaints.  Contractions: Not present.  .  Movement: Present. Denies leaking of fluid.   The following portions of the patient's history were reviewed and updated as appropriate: allergies, current medications, past family history, past medical history, past social history, past surgical history and problem list. Problem list updated.  Objective:   Filed Vitals:   07/20/15 1416  BP: 116/47  Pulse: 98  Weight: 235 lb 9.6 oz (106.867 kg)    Fetal Status: Fetal Heart Rate (bpm): 133 Fundal Height: 36 cm Movement: Present     General:  Alert, oriented and cooperative. Patient is in no acute distress.  Skin: Skin is warm and dry. No rash noted.   Cardiovascular: Normal heart rate noted  Respiratory: Normal respiratory effort, no problems with respiration noted  Abdomen: Soft, gravid, appropriate for gestational age. Pain/Pressure: Present     Pelvic:       Cervical exam deferred        Extremities: Normal range of motion.  Edema: Moderate pitting, indentation subsides rapidly  Mental Status: Normal mood and affect. Normal behavior. Normal judgment and thought content.   Urinalysis: Urine Protein: Negative Urine Glucose: Negative  Assessment and Plan:  Pregnancy: G2P1001 at 4763w2d  1. Supervision of high risk pregnancy, antepartum, third trimester - US MFM OB FOLLOW UP; Future > limited views  2. Previous cesarean delivery, delivered - TOLAC consent signed today  Preterm labor symptoms and general obstetric precautions including but not limited to vaginal bleeding, contractions, leaking of fluid and fetal movement were  reviewed in detail with the patient. Please refer to After Visit Summary for other counseling recommendations.  Return in about 1 week (around 07/27/2015).   Eino FarberWalidah Kennith GainN Karim, CNM

## 2015-07-22 ENCOUNTER — Encounter: Payer: Self-pay | Admitting: *Deleted

## 2015-07-26 ENCOUNTER — Other Ambulatory Visit: Payer: Self-pay | Admitting: Family

## 2015-07-26 ENCOUNTER — Ambulatory Visit (HOSPITAL_COMMUNITY)
Admission: RE | Admit: 2015-07-26 | Discharge: 2015-07-26 | Disposition: A | Discharge status: Home / Self Care | Payer: Medicaid Other | Source: Ambulatory Visit | Attending: Family | Admitting: Family

## 2015-07-26 ENCOUNTER — Ambulatory Visit (INDEPENDENT_AMBULATORY_CARE_PROVIDER_SITE_OTHER): Payer: Self-pay | Admitting: Family Medicine

## 2015-07-26 VITALS — BP 110/60 | HR 69 | Wt 235.4 lb

## 2015-07-26 DIAGNOSIS — O0993 Supervision of high risk pregnancy, unspecified, third trimester: Secondary | ICD-10-CM

## 2015-07-26 DIAGNOSIS — O99213 Obesity complicating pregnancy, third trimester: Secondary | ICD-10-CM | POA: Diagnosis not present

## 2015-07-26 DIAGNOSIS — O0933 Supervision of pregnancy with insufficient antenatal care, third trimester: Secondary | ICD-10-CM | POA: Diagnosis not present

## 2015-07-26 DIAGNOSIS — Z3A36 36 weeks gestation of pregnancy: Secondary | ICD-10-CM

## 2015-07-26 LAB — POCT URINALYSIS DIP (DEVICE)
Bilirubin Urine: NEGATIVE
Glucose, UA: NEGATIVE mg/dL
HGB URINE DIPSTICK: NEGATIVE
Ketones, ur: NEGATIVE mg/dL
LEUKOCYTES UA: NEGATIVE
NITRITE: NEGATIVE
PH: 7 (ref 5.0–8.0)
PROTEIN: NEGATIVE mg/dL
SPECIFIC GRAVITY, URINE: 1.02 (ref 1.005–1.030)
UROBILINOGEN UA: 1 mg/dL (ref 0.0–1.0)

## 2015-07-26 LAB — OB RESULTS CONSOLE GC/CHLAMYDIA: Gonorrhea: NEGATIVE

## 2015-07-26 LAB — OB RESULTS CONSOLE GBS: GBS: NEGATIVE

## 2015-07-26 NOTE — Progress Notes (Signed)
36 wk cultures today   

## 2015-07-26 NOTE — Progress Notes (Signed)
Subjective:  Tanya [name] is a 25 y.o. G2P1001 at 9861w1d being seen today for ongoing prenatal care.  She is currently monitored for the following issues for this low-risk pregnancy and has Supervision of high risk pregnancy, antepartum; Insufficient prenatal care in third trimester; and Previous cesarean delivery, delivered on her problem list.  Patient reports no complaints.  Contractions: Not present. Vag. Bleeding: None.  Movement: Present. Denies leaking of fluid.   The following portions of the patient's history were reviewed and updated as appropriate: allergies, current medications, past family history, past medical history, past social history, past surgical history and problem list. Problem list updated.  Objective:   Filed Vitals:   07/26/15 1401  BP: 110/60  Pulse: 69  Weight: 235 lb 6.4 oz (106.777 kg)    Fetal Status: Fetal Heart Rate (bpm): 140   Movement: Present  Presentation: Vertex  General:  Alert, oriented and cooperative. Patient is in no acute distress.  Skin: Skin is warm and dry. No rash noted.   Cardiovascular: Normal heart rate noted  Respiratory: Normal respiratory effort, no problems with respiration noted  Abdomen: Soft, gravid, appropriate for gestational age. Pain/Pressure: Present     Pelvic: Vag. Bleeding: None     Cervical exam performed Dilation: Fingertip Effacement (%): Thick    Extremities: Normal range of motion.  Edema: Mild pitting, slight indentation  Mental Status: Normal mood and affect. Normal behavior. Normal judgment and thought content.   Urinalysis: Urine Protein: Trace Urine Glucose: Negative  Assessment and Plan:  Pregnancy: G2P1001 at 6761w1d  1. Supervision of high risk pregnancy, antepartum, third trimester FHT and FH - Culture, beta strep (group b only) - GC[facility]Chlamydia probe amp ([place]  Preterm labor symptoms and general obstetric precautions including but not limited to vaginal bleeding, contractions,  leaking of fluid and fetal movement were reviewed in detail with the patient. Please refer to After Visit Summary for other counseling recommendations.  No Follow-up on file.   Levie HeritageJacob J Tasfia Vasseur, DO

## 2015-07-27 LAB — GC/CHLAMYDIA PROBE AMP (~~LOC~~) NOT AT ARMC
CHLAMYDIA, DNA PROBE: NEGATIVE
NEISSERIA GONORRHEA: NEGATIVE

## 2015-07-28 LAB — CULTURE, BETA STREP (GROUP B ONLY)

## 2015-08-04 ENCOUNTER — Encounter: Payer: Self-pay | Admitting: Obstetrics and Gynecology

## 2015-08-04 ENCOUNTER — Ambulatory Visit (INDEPENDENT_AMBULATORY_CARE_PROVIDER_SITE_OTHER): Payer: Medicaid Other | Admitting: Obstetrics and Gynecology

## 2015-08-04 VITALS — BP 99/55 | HR 88 | Wt 241.7 lb

## 2015-08-04 DIAGNOSIS — O34219 Maternal care for unspecified type scar from previous cesarean delivery: Secondary | ICD-10-CM

## 2015-08-04 DIAGNOSIS — O0993 Supervision of high risk pregnancy, unspecified, third trimester: Secondary | ICD-10-CM

## 2015-08-04 DIAGNOSIS — Z3483 Encounter for supervision of other normal pregnancy, third trimester: Secondary | ICD-10-CM | POA: Diagnosis not present

## 2015-08-04 LAB — POCT URINALYSIS DIP (DEVICE)
Bilirubin Urine: NEGATIVE
Glucose, UA: NEGATIVE mg/dL
HGB URINE DIPSTICK: NEGATIVE
Ketones, ur: NEGATIVE mg/dL
NITRITE: NEGATIVE
PH: 7 (ref 5.0–8.0)
PROTEIN: 30 mg/dL — AB
SPECIFIC GRAVITY, URINE: 1.025 (ref 1.005–1.030)
UROBILINOGEN UA: 1 mg/dL (ref 0.0–1.0)

## 2015-08-04 NOTE — Progress Notes (Signed)
Breastfeeding tip reviewed 

## 2015-08-04 NOTE — Progress Notes (Signed)
Subjective:  Tanya [name] is a 25 y.o. G2P1001 at 5959w3d being seen today for ongoing prenatal care.  She is currently monitored for the following issues for this low-risk pregnancy and has Supervision of high risk pregnancy, antepartum; Insufficient prenatal care in third trimester; and Previous cesarean delivery, delivered on her problem list.  Patient reports no complaints.   Contractions: Not present. Vag. Bleeding: None.  Movement: Present. Denies leaking of fluid.   The following portions of the patient's history were reviewed and updated as appropriate: allergies, current medications, past family history, past medical history, past social history, past surgical history and problem list. Problem list updated.  Objective:   Filed Vitals:   08/04/15 1322  BP: 99/55  Pulse: 88  Weight: 241 lb 11.2 oz (109.634 kg)    Fetal Status: Fetal Heart Rate (bpm): 138   Movement: Present     General:  Alert, oriented and cooperative. Patient is in no acute distress.  Skin: Skin is warm and dry. No rash noted.   Cardiovascular: Normal heart rate noted  Respiratory: Normal respiratory effort, no problems with respiration noted  Abdomen: Soft, gravid, appropriate for gestational age. Pain/Pressure: Present     Pelvic:  Cervical exam deferred        Extremities: Normal range of motion.  Edema: Mild pitting, slight indentation  Mental Status: Normal mood and affect. Normal behavior. Normal judgment and thought content.   Urinalysis: Urine Protein: 1+ Urine Glucose: Negative  Assessment and Plan:  Pregnancy: G2P1001 at 259w3d Routine care. TED hoses advised Pt states she got to pushing but baby's HR would drop so that's why she had a c-section.  There are no diagnoses linked to this encounter. Term labor symptoms and general obstetric precautions including but not limited to vaginal bleeding, contractions, leaking of fluid and fetal movement were reviewed in detail with the patient. Please  refer to After Visit Summary for other counseling recommendations.  1wk RTC    Bingharlie Joycelynn Fritsche, MD

## 2015-08-10 ENCOUNTER — Ambulatory Visit (INDEPENDENT_AMBULATORY_CARE_PROVIDER_SITE_OTHER): Payer: Medicaid Other | Admitting: Advanced Practice Midwife

## 2015-08-10 ENCOUNTER — Encounter: Payer: Self-pay | Admitting: Family Medicine

## 2015-08-10 VITALS — BP 97/57 | HR 90 | Temp 98.4°F | Wt 241.3 lb

## 2015-08-10 DIAGNOSIS — Z3483 Encounter for supervision of other normal pregnancy, third trimester: Secondary | ICD-10-CM

## 2015-08-10 DIAGNOSIS — O34219 Maternal care for unspecified type scar from previous cesarean delivery: Secondary | ICD-10-CM | POA: Diagnosis not present

## 2015-08-10 LAB — POCT URINALYSIS DIP (DEVICE)
Bilirubin Urine: NEGATIVE
Glucose, UA: NEGATIVE mg/dL
HGB URINE DIPSTICK: NEGATIVE
KETONES UR: NEGATIVE mg/dL
Nitrite: NEGATIVE
Protein, ur: NEGATIVE mg/dL
SPECIFIC GRAVITY, URINE: 1.015 (ref 1.005–1.030)
UROBILINOGEN UA: 0.2 mg/dL (ref 0.0–1.0)
pH: 7 (ref 5.0–8.0)

## 2015-08-10 NOTE — Progress Notes (Signed)
Subjective:  Tanya [name] is a 25 y.o. G2P1001 at 3481w2d being seen today for ongoing prenatal care.  She is currently monitored for the following issues for this low-risk pregnancy and has Supervision of high risk pregnancy, antepartum; Insufficient prenatal care in third trimester; and Previous cesarean delivery, delivered on her problem list.  Patient reports pelvic pressure that is constant, especially when standing.  Contractions: Not present. Vag. Bleeding: None.  Movement: Present. Denies leaking of fluid.   The following portions of the patient's history were reviewed and updated as appropriate: allergies, current medications, past family history, past medical history, past social history, past surgical history and problem list. Problem list updated.  Objective:   Filed Vitals:   08/10/15 0758  BP: 97/57  Pulse: 90  Temp: 98.4 F (36.9 C)  Weight: 241 lb 4.8 oz (109.453 kg)    Fetal Status: Fetal Heart Rate (bpm): 128 Fundal Height: 39 cm Movement: Present  Presentation: Vertex  General:  Alert, oriented and cooperative. Patient is in no acute distress.  Skin: Skin is warm and dry. No rash noted.   Cardiovascular: Normal heart rate noted  Respiratory: Normal respiratory effort, no problems with respiration noted  Abdomen: Soft, gravid, appropriate for gestational age. Pain/Pressure: Present     Pelvic: Cervical exam performed Dilation: 2 Effacement (%): 50 Station: -2  Extremities: Normal range of motion.  Edema: Mild pitting, slight indentation  Mental Status: Normal mood and affect. Normal behavior. Normal judgment and thought content.   Urinalysis: Urine Protein: Negative Urine Glucose: Negative  Assessment and Plan:  Pregnancy: G2P1001 at 5981w2d  1. Previous cesarean delivery, delivered --TOLAC consent signed and in chart. --Pt trying to decide whether to keep working b/c she is on her feet. Encouraged pt to continue working, as she is 2 cm dilated but this does not  indicate when labor will begin.  Pt agrees with plan and will continue working this week and reevaluate if still pregnant next week.   Term labor symptoms and general obstetric precautions including but not limited to vaginal bleeding, contractions, leaking of fluid and fetal movement were reviewed in detail with the patient. Please refer to After Visit Summary for other counseling recommendations.  Return in about 1 week (around 08/17/2015).   Hurshel PartyLisa A Leftwich-Kirby, CNM

## 2015-08-10 NOTE — Patient Instructions (Signed)
Labor Precautions Reasons to come to MAU:  1.  Contractions are  5 minutes apart or less, each last 1 minute, these have been going on for 1-2 hours, and you cannot walk or talk during them 2.  You have a large gush of fluid, or a trickle of fluid that will not stop and you have to wear a pad 3.  You have bleeding that is bright red, heavier than spotting--like menstrual bleeding (spotting can be normal in early labor or after a check of your cervix) 4.  You do not feel the baby moving like he/she normally does Third Trimester of Pregnancy The third trimester is from week 29 through week 42, months 7 through 9. The third trimester is a time when the fetus is growing rapidly. At the end of the ninth month, the fetus is about 20 inches in length and weighs 6-10 pounds.  BODY CHANGES Your body goes through many changes during pregnancy. The changes vary from woman to woman.   Your weight will continue to increase. You can expect to gain 25-35 pounds (11-16 kg) by the end of the pregnancy.  You may begin to get stretch marks on your hips, abdomen, and breasts.  You may urinate more often because the fetus is moving lower into your pelvis and pressing on your bladder.  You may develop or continue to have heartburn as a result of your pregnancy.  You may develop constipation because certain hormones are causing the muscles that push waste through your intestines to slow down.  You may develop hemorrhoids or swollen, bulging veins (varicose veins).  You may have pelvic pain because of the weight gain and pregnancy hormones relaxing your joints between the bones in your pelvis. Backaches may result from overexertion of the muscles supporting your posture.  You may have changes in your hair. These can include thickening of your hair, rapid growth, and changes in texture. Some women also have hair loss during or after pregnancy, or hair that feels dry or thin. Your hair will most likely return to  normal after your baby is born.  Your breasts will continue to grow and be tender. A yellow discharge may leak from your breasts called colostrum.  Your belly button may stick out.  You may feel short of breath because of your expanding uterus.  You may notice the fetus "dropping," or moving lower in your abdomen.  You may have a bloody mucus discharge. This usually occurs a few days to a week before labor begins.  Your cervix becomes thin and soft (effaced) near your due date. WHAT TO EXPECT AT YOUR PRENATAL EXAMS  You will have prenatal exams every 2 weeks until week 36. Then, you will have weekly prenatal exams. During a routine prenatal visit:  You will be weighed to make sure you and the fetus are growing normally.  Your blood pressure is taken.  Your abdomen will be measured to track your baby's growth.  The fetal heartbeat will be listened to.  Any test results from the previous visit will be discussed.  You may have a cervical check near your due date to see if you have effaced. At around 36 weeks, your caregiver will check your cervix. At the same time, your caregiver will also perform a test on the secretions of the vaginal tissue. This test is to determine if a type of bacteria, Group B streptococcus, is present. Your caregiver will explain this further. Your caregiver may ask you:  What   your birth plan is.  How you are feeling.  If you are feeling the baby move.  If you have had any abnormal symptoms, such as leaking fluid, bleeding, severe headaches, or abdominal cramping.  If you are using any tobacco products, including cigarettes, chewing tobacco, and electronic cigarettes.  If you have any questions. Other tests or screenings that may be performed during your third trimester include:  Blood tests that check for low iron levels (anemia).  Fetal testing to check the health, activity level, and growth of the fetus. Testing is done if you have certain medical  conditions or if there are problems during the pregnancy.  HIV (human immunodeficiency virus) testing. If you are at high risk, you may be screened for HIV during your third trimester of pregnancy. FALSE LABOR You may feel small, irregular contractions that eventually go away. These are called Braxton Hicks contractions, or false labor. Contractions may last for hours, days, or even weeks before true labor sets in. If contractions come at regular intervals, intensify, or become painful, it is best to be seen by your caregiver.  SIGNS OF LABOR   Menstrual-like cramps.  Contractions that are 5 minutes apart or less.  Contractions that start on the top of the uterus and spread down to the lower abdomen and back.  A sense of increased pelvic pressure or back pain.  A watery or bloody mucus discharge that comes from the vagina. If you have any of these signs before the 37th week of pregnancy, call your caregiver right away. You need to go to the hospital to get checked immediately. HOME CARE INSTRUCTIONS   Avoid all smoking, herbs, alcohol, and unprescribed drugs. These chemicals affect the formation and growth of the baby.  Do not use any tobacco products, including cigarettes, chewing tobacco, and electronic cigarettes. If you need help quitting, ask your health care provider. You may receive counseling support and other resources to help you quit.  Follow your caregiver's instructions regarding medicine use. There are medicines that are either safe or unsafe to take during pregnancy.  Exercise only as directed by your caregiver. Experiencing uterine cramps is a good sign to stop exercising.  Continue to eat regular, healthy meals.  Wear a good support bra for breast tenderness.  Do not use hot tubs, steam rooms, or saunas.  Wear your seat belt at all times when driving.  Avoid raw meat, uncooked cheese, cat litter boxes, and soil used by cats. These carry germs that can cause birth  defects in the baby.  Take your prenatal vitamins.  Take 1500-2000 mg of calcium daily starting at the 20th week of pregnancy until you deliver your baby.  Try taking a stool softener (if your caregiver approves) if you develop constipation. Eat more high-fiber foods, such as fresh vegetables or fruit and whole grains. Drink plenty of fluids to keep your urine clear or pale yellow.  Take warm sitz baths to soothe any pain or discomfort caused by hemorrhoids. Use hemorrhoid cream if your caregiver approves.  If you develop varicose veins, wear support hose. Elevate your feet for 15 minutes, 3-4 times a day. Limit salt in your diet.  Avoid heavy lifting, wear low heal shoes, and practice good posture.  Rest a lot with your legs elevated if you have leg cramps or low back pain.  Visit your dentist if you have not gone during your pregnancy. Use a soft toothbrush to brush your teeth and be gentle when you floss.    A sexual relationship may be continued unless your caregiver directs you otherwise.  Do not travel far distances unless it is absolutely necessary and only with the approval of your caregiver.  Take prenatal classes to understand, practice, and ask questions about the labor and delivery.  Make a trial run to the hospital.  Pack your hospital bag.  Prepare the baby's nursery.  Continue to go to all your prenatal visits as directed by your caregiver. SEEK MEDICAL CARE IF:  You are unsure if you are in labor or if your water has broken.  You have dizziness.  You have mild pelvic cramps, pelvic pressure, or nagging pain in your abdominal area.  You have persistent nausea, vomiting, or diarrhea.  You have a bad smelling vaginal discharge.  You have pain with urination. SEEK IMMEDIATE MEDICAL CARE IF:   You have a fever.  You are leaking fluid from your vagina.  You have spotting or bleeding from your vagina.  You have severe abdominal cramping or pain.  You have  rapid weight loss or gain.  You have shortness of breath with chest pain.  You notice sudden or extreme swelling of your face, hands, ankles, feet, or legs.  You have not felt your baby move in over an hour.  You have severe headaches that do not go away with medicine.  You have vision changes.   This information is not intended to replace advice given to you by your health care provider. Make sure you discuss any questions you have with your health care provider.   Document Released: 01/31/2001 Document Revised: 02/27/2014 Document Reviewed: 04/09/2012 Elsevier Interactive Patient Education 2016 [employer]  

## 2015-08-14 ENCOUNTER — Inpatient Hospital Stay (HOSPITAL_COMMUNITY): Payer: Medicaid Other | Admitting: Anesthesiology

## 2015-08-14 ENCOUNTER — Encounter (HOSPITAL_COMMUNITY): Payer: Self-pay | Admitting: *Deleted

## 2015-08-14 ENCOUNTER — Inpatient Hospital Stay (HOSPITAL_COMMUNITY)
Admission: AD | Admit: 2015-08-14 | Discharge: 2015-08-16 | DRG: 775 | Disposition: A | Discharge status: Home / Self Care | Payer: Medicaid Other | Source: Ambulatory Visit | Attending: Family Medicine | Admitting: Family Medicine

## 2015-08-14 DIAGNOSIS — Z8249 Family history of ischemic heart disease and other diseases of the circulatory system: Secondary | ICD-10-CM

## 2015-08-14 DIAGNOSIS — Z6841 Body Mass Index (BMI) 40.0 and over, adult: Secondary | ICD-10-CM

## 2015-08-14 DIAGNOSIS — O34211 Maternal care for low transverse scar from previous cesarean delivery: Secondary | ICD-10-CM | POA: Diagnosis present

## 2015-08-14 DIAGNOSIS — Z3A38 38 weeks gestation of pregnancy: Secondary | ICD-10-CM | POA: Diagnosis not present

## 2015-08-14 DIAGNOSIS — O4202 Full-term premature rupture of membranes, onset of labor within 24 hours of rupture: Secondary | ICD-10-CM | POA: Diagnosis present

## 2015-08-14 DIAGNOSIS — O4290 Premature rupture of membranes, unspecified as to length of time between rupture and onset of labor, unspecified weeks of gestation: Secondary | ICD-10-CM | POA: Diagnosis present

## 2015-08-14 DIAGNOSIS — O99214 Obesity complicating childbirth: Secondary | ICD-10-CM | POA: Diagnosis present

## 2015-08-14 LAB — CBC
HCT: 33.7 % — ABNORMAL LOW (ref 36.0–46.0)
Hemoglobin: 12.2 g/dL (ref 12.0–15.0)
MCH: 31.9 pg (ref 26.0–34.0)
MCHC: 36.2 g/dL — AB (ref 30.0–36.0)
MCV: 88 fL (ref 78.0–100.0)
PLATELETS: 235 10*3/uL (ref 150–400)
RBC: 3.83 MIL/uL — ABNORMAL LOW (ref 3.87–5.11)
RDW: 13.7 % (ref 11.5–15.5)
WBC: 9 10*3/uL (ref 4.0–10.5)

## 2015-08-14 LAB — TYPE AND SCREEN
ABO/RH(D): O POS
Antibody Screen: NEGATIVE

## 2015-08-14 LAB — POCT FERN TEST: POCT FERN TEST: POSITIVE

## 2015-08-14 LAB — RPR: RPR Ser Ql: NONREACTIVE

## 2015-08-14 LAB — ABO/RH: ABO/RH(D): O POS

## 2015-08-14 MED ORDER — DIPHENHYDRAMINE HCL 50 MG/ML IJ SOLN: 12.5000 mg | INTRAMUSCULAR | Status: DC | PRN

## 2015-08-14 MED ORDER — PHENYLEPHRINE 40 MCG/ML (10ML) SYRINGE FOR IV PUSH (FOR BLOOD PRESSURE SUPPORT)
PREFILLED_SYRINGE | INTRAVENOUS | Status: AC
  Filled 2015-08-14: qty 20

## 2015-08-14 MED ORDER — LACTATED RINGERS IV SOLN
500.0000 mL | Freq: Once | INTRAVENOUS | Status: AC
  Administered 2015-08-14: 500 mL via INTRAVENOUS

## 2015-08-14 MED ORDER — DIPHENHYDRAMINE HCL 25 MG PO CAPS: 25.0000 mg | ORAL_CAPSULE | Freq: Four times a day (QID) | ORAL | Status: DC | PRN

## 2015-08-14 MED ORDER — TERBUTALINE SULFATE 1 MG/ML IJ SOLN
0.2500 mg | Freq: Once | INTRAMUSCULAR | Status: AC
  Administered 2015-08-14: 0.25 mg via SUBCUTANEOUS

## 2015-08-14 MED ORDER — TERBUTALINE SULFATE 1 MG/ML IJ SOLN
INTRAMUSCULAR | Status: AC
  Filled 2015-08-14: qty 1

## 2015-08-14 MED ORDER — PHENYLEPHRINE 40 MCG/ML (10ML) SYRINGE FOR IV PUSH (FOR BLOOD PRESSURE SUPPORT)
80.0000 ug | PREFILLED_SYRINGE | INTRAVENOUS | Status: DC | PRN
  Filled 2015-08-14: qty 5

## 2015-08-14 MED ORDER — OXYTOCIN BOLUS FROM INFUSION
500.0000 mL | INTRAVENOUS | Status: DC
  Administered 2015-08-14: 500 mL via INTRAVENOUS

## 2015-08-14 MED ORDER — ONDANSETRON HCL 4 MG/2ML IJ SOLN: 4.0000 mg | INTRAMUSCULAR | Status: DC | PRN

## 2015-08-14 MED ORDER — PRENATAL MULTIVITAMIN CH
1.0000 | ORAL_TABLET | Freq: Every day | ORAL | Status: DC
  Administered 2015-08-15: 1 via ORAL
  Filled 2015-08-14: qty 1

## 2015-08-14 MED ORDER — ONDANSETRON HCL 4 MG PO TABS: 4.0000 mg | ORAL_TABLET | ORAL | Status: DC | PRN

## 2015-08-14 MED ORDER — PHENYLEPHRINE 40 MCG/ML (10ML) SYRINGE FOR IV PUSH (FOR BLOOD PRESSURE SUPPORT)
80.0000 ug | PREFILLED_SYRINGE | INTRAVENOUS | Status: DC | PRN
  Administered 2015-08-14 (×2): 80 ug via INTRAVENOUS
  Filled 2015-08-14: qty 5

## 2015-08-14 MED ORDER — SENNOSIDES-DOCUSATE SODIUM 8.6-50 MG PO TABS
2.0000 | ORAL_TABLET | ORAL | Status: DC
  Administered 2015-08-15 – 2015-08-16 (×2): 2 via ORAL
  Filled 2015-08-14 (×2): qty 2

## 2015-08-14 MED ORDER — EPHEDRINE 5 MG/ML INJ
10.0000 mg | INTRAVENOUS | Status: DC | PRN
  Filled 2015-08-14: qty 2

## 2015-08-14 MED ORDER — ACETAMINOPHEN 325 MG PO TABS: 650.0000 mg | ORAL_TABLET | ORAL | Status: DC | PRN

## 2015-08-14 MED ORDER — FLEET ENEMA 7-19 GM/118ML RE ENEM: 1.0000 | ENEMA | RECTAL | Status: DC | PRN

## 2015-08-14 MED ORDER — FENTANYL CITRATE (PF) 100 MCG/2ML IJ SOLN
100.0000 ug | INTRAMUSCULAR | Status: DC | PRN
  Administered 2015-08-14: 100 ug via INTRAVENOUS

## 2015-08-14 MED ORDER — ONDANSETRON HCL 4 MG/2ML IJ SOLN: 4.0000 mg | Freq: Four times a day (QID) | INTRAMUSCULAR | Status: DC | PRN

## 2015-08-14 MED ORDER — LIDOCAINE HCL (PF) 1 % IJ SOLN
INTRAMUSCULAR | Status: DC | PRN
  Administered 2015-08-14 (×2): 5 mL

## 2015-08-14 MED ORDER — COCONUT OIL OIL: 1.0000 "application " | TOPICAL_OIL | Status: DC | PRN

## 2015-08-14 MED ORDER — FENTANYL 2.5 MCG/ML BUPIVACAINE 1/10 % EPIDURAL INFUSION (WH - ANES)
14.0000 mL/h | INTRAMUSCULAR | Status: DC | PRN
  Administered 2015-08-14: 14 mL/h via EPIDURAL

## 2015-08-14 MED ORDER — SOD CITRATE-CITRIC ACID 500-334 MG/5ML PO SOLN: 30.0000 mL | ORAL | Status: DC | PRN

## 2015-08-14 MED ORDER — FENTANYL CITRATE (PF) 100 MCG/2ML IJ SOLN
INTRAMUSCULAR | Status: AC
  Filled 2015-08-14: qty 2

## 2015-08-14 MED ORDER — LACTATED RINGERS IV SOLN
INTRAVENOUS | Status: DC
  Administered 2015-08-14: 03:00:00 via INTRAVENOUS

## 2015-08-14 MED ORDER — WITCH HAZEL-GLYCERIN EX PADS: 1.0000 "application " | MEDICATED_PAD | CUTANEOUS | Status: DC | PRN

## 2015-08-14 MED ORDER — OXYCODONE-ACETAMINOPHEN 5-325 MG PO TABS: 1.0000 | ORAL_TABLET | ORAL | Status: DC | PRN

## 2015-08-14 MED ORDER — OXYTOCIN 40 UNITS IN LACTATED RINGERS INFUSION - SIMPLE MED
2.5000 [IU]/h | INTRAVENOUS | Status: DC
  Filled 2015-08-14: qty 1000

## 2015-08-14 MED ORDER — LIDOCAINE HCL (PF) 1 % IJ SOLN
30.0000 mL | INTRAMUSCULAR | Status: DC | PRN
  Filled 2015-08-14: qty 30

## 2015-08-14 MED ORDER — TETANUS-DIPHTH-ACELL PERTUSSIS 5-2.5-18.5 LF-MCG/0.5 IM SUSP
0.5000 mL | Freq: Once | INTRAMUSCULAR | Status: DC
Start: 2015-08-15 — End: 2015-08-16

## 2015-08-14 MED ORDER — DIBUCAINE 1 % RE OINT: 1.0000 "application " | TOPICAL_OINTMENT | RECTAL | Status: DC | PRN

## 2015-08-14 MED ORDER — IBUPROFEN 600 MG PO TABS
600.0000 mg | ORAL_TABLET | Freq: Four times a day (QID) | ORAL | Status: DC
  Administered 2015-08-14 – 2015-08-16 (×7): 600 mg via ORAL
  Filled 2015-08-14 (×7): qty 1

## 2015-08-14 MED ORDER — ACETAMINOPHEN 325 MG PO TABS
650.0000 mg | ORAL_TABLET | ORAL | Status: DC | PRN
  Administered 2015-08-15: 650 mg via ORAL
  Filled 2015-08-14: qty 2

## 2015-08-14 MED ORDER — FENTANYL 2.5 MCG/ML BUPIVACAINE 1/10 % EPIDURAL INFUSION (WH - ANES)
INTRAMUSCULAR | Status: AC
  Administered 2015-08-14: 06:00:00
  Filled 2015-08-14: qty 125

## 2015-08-14 MED ORDER — BENZOCAINE-MENTHOL 20-0.5 % EX AERO
1.0000 "application " | INHALATION_SPRAY | CUTANEOUS | Status: DC | PRN
  Administered 2015-08-14: 1 via TOPICAL
  Filled 2015-08-14: qty 56

## 2015-08-14 MED ORDER — SIMETHICONE 80 MG PO CHEW: 80.0000 mg | CHEWABLE_TABLET | ORAL | Status: DC | PRN

## 2015-08-14 MED ORDER — OXYCODONE-ACETAMINOPHEN 5-325 MG PO TABS: 2.0000 | ORAL_TABLET | ORAL | Status: DC | PRN

## 2015-08-14 MED ORDER — LACTATED RINGERS IV SOLN
500.0000 mL | INTRAVENOUS | Status: DC | PRN
  Administered 2015-08-14: 500 mL via INTRAVENOUS

## 2015-08-14 NOTE — MAU Note (Signed)
Contractions since 2100. Denies LOF but some bloody show. Prior C/S and desires TOLAC

## 2015-08-14 NOTE — H&P (Signed)
LABOR AND DELIVERY ADMISSION HISTORY AND PHYSICAL NOTE  Tanya [name] is a 25 y.o. female G2P1001 with IUP at 2058w6d by LMP presenting for PROM@2200  on 6/23.  Prenatal risk factors: Late PNC ~30 weeks, morbid obesity, H/O c-s for fetal indications during pushing She reports positive fetal movement. She denies leakage of fluid or vaginal bleeding.  Prenatal History/Complications:  Past Medical History: Past Medical History  Diagnosis Date  . Headache     Past Surgical History: Past Surgical History  Procedure Laterality Date  . Cesarean section      Obstetrical History: OB History    Gravida Para Term Preterm AB TAB SAB Ectopic Multiple Living   2 1 1       1       Social History: Social History   Social History  . Marital Status: Married    Spouse Name: N/A  . Number of Children: N/A  . Years of Education: N/A   Social History Main Topics  . Smoking status: Never Smoker   . Smokeless tobacco: Never Used  . Alcohol Use: Yes     Comment: prior to finging out pregnancy  . Drug Use: No  . Sexual Activity: Yes    Birth Control/ Protection: None   Other Topics Concern  . None   Social History Narrative    Family History: Family History  Problem Relation Age of Onset  . Hypertension Maternal Grandmother     Allergies: No Known Allergies  Prescriptions prior to admission  Medication Sig Dispense Refill Last Dose  . Prenatal Vit-Fe Fumarate-FA (PRENATAL MULTIVITAMIN) TABS tablet Take 1 tablet by mouth daily at 12 noon.   Taking     Review of Systems   All systems reviewed and negative except as stated in HPI  Blood pressure 122/64, pulse 82, temperature 97.9 F (36.6 C), resp. rate 20, height 5\' 2"  (1.575 m), weight 242 lb 12.8 oz (110.133 kg), last menstrual period 11/15/2014. General appearance: alert and cooperative Lungs: clear to auscultation bilaterally Heart: regular rate and rhythm Abdomen: soft, non-tender; bowel sounds normal Extremities: No  calf swelling or tenderness Presentation: cephalic Fetal monitoring: Baseline 120-90 (moving), mod variability, early decels, +acels Uterine activity: q3-4 min Dilation: 2.5 Effacement (%): 90 Station: -3 Exam by:: Domenic PoliteK. WeissRN   Prenatal labs: ABO, Rh: O/POS/-- (04/27 0923) Antibody: NEG (04/27 0923) Rubella: Immune RPR: NON REAC (04/27 0923)  HBsAg: NEGATIVE (04/27 0923)  HIV: NONREACTIVE (04/27 0923)  GBS: Negative (06/05 0000)  1 hr Glucola: 98 Genetic screening:  [facility] limited but wnl  Prenatal Transfer Tool  Maternal Diabetes: No Genetic Screening: Declined Maternal Ultrasounds/Referrals: Normal Fetal Ultrasounds or other Referrals:  None Maternal Substance Abuse:  No Significant Maternal Medications:  None Significant Maternal Lab Results: None  Results for orders placed or performed during the hospital encounter of 08/14/15 (from the past 24 hour(s))  Fern Test   Collection Time: 08/14/15  3:03 AM  Result Value Ref Range   POCT Fern Test Positive = ruptured amniotic membanes     Patient Active Problem List   Diagnosis Date Noted  . Supervision of high risk pregnancy, antepartum 06/17/2015  . Insufficient prenatal care in third trimester 06/17/2015  . Previous cesarean delivery, delivered 06/17/2015    Assessment: Tanya [name] is a 25 y.o. G2P1001 at 4358w6d here for PROM@2200  on 6/23  #Labor:Active, monitor for next 2-3 hours, if contractions diminish consider starting pitocin to augment #Pain: Epidural #FWB: Will place on monitor, tracing  in MAU moving, Cat-I or II (decels) #ID:  GBS negative #MOF: Formula #MOC:Nexplanon #Circ:  Female, not indicated  Kelly M Aguilar 08/14/2015, 3:08 AM   OB fellow attestation:   I Olena Leatherwoodagree with above documentation in the resident's note.   Tanya [name] is a 25 y.o. G2P1001 here for PROM/early labor; desires VBAC  PE: BP 91/72 mmHg  Pulse 98  Temp(Src) 98 F (36.7 C) (Oral)  Resp 18  Ht  5\' 1"  (1.549 m)  Wt 109.77 kg (242 lb)  BMI 45.75 kg/m2  SpO2 98%  LMP 11/15/2014 (Approximate) Gen: calm comfortable, NAD Resp: normal effort, no distress Abd: gravid  ROS, labs, PMH reviewed  [facility] Anticipate SVD  Cam HaiSHAW, KIMBERLY CNM 08/14/2015, 9:53 AM

## 2015-08-14 NOTE — Anesthesia Postprocedure Evaluation (Signed)
Anesthesia Post Note  Patient: Charmian MuffLatisha Blann  Procedure(s) Performed: * No procedures listed *  Patient location during evaluation: Mother Baby Anesthesia Type: Epidural Level of consciousness: oriented and awake and alert Pain management: pain level controlled Vital Signs Assessment: post-procedure vital signs reviewed and stable Respiratory status: spontaneous breathing and nonlabored ventilation Cardiovascular status: stable Postop Assessment: epidural receding, patient able to bend at knees, no signs of nausea or vomiting and adequate PO intake Anesthetic complications: no     Last Vitals:  Filed Vitals:   08/14/15 1031 08/14/15 1046  BP: 118/46 116/63  Pulse: 96 87  Temp:    Resp:      Last Pain:  Filed Vitals:   08/14/15 1154  PainSc: 10-Worst pain ever   Pain Goal: Patients Stated Pain Goal: 0 (08/14/15 0246)               Laban EmperorMalinova,Drey Shaff Hristova

## 2015-08-14 NOTE — Anesthesia Preprocedure Evaluation (Signed)

## 2015-08-14 NOTE — Anesthesia Procedure Notes (Signed)
Epidural Patient location during procedure: OB  Staffing Anesthesiologist: Terius Jacuinde Performed by: anesthesiologist   Preanesthetic Checklist Completed: patient identified, site marked, surgical consent, pre-op evaluation, timeout performed, IV checked, risks and benefits discussed and monitors and equipment checked  Epidural Patient position: sitting Prep: DuraPrep Patient monitoring: heart rate, continuous pulse ox and blood pressure Approach: right paramedian Location: L3-L4 Injection technique: LOR saline  Needle:  Needle type: Tuohy  Needle gauge: 17 G Needle length: 9 cm and 9 Needle insertion depth: 7 cm Catheter type: closed end flexible Catheter size: 20 Guage Catheter at skin depth: 11 cm Test dose: negative  Assessment Events: blood not aspirated, injection not painful, no injection resistance, negative IV test and no paresthesia  Additional Notes Patient identified. Risks/Benefits/Options discussed with patient including but not limited to bleeding, infection, nerve damage, paralysis, failed block, incomplete pain control, headache, blood pressure changes, nausea, vomiting, reactions to medication both or allergic, itching and postpartum back pain. Confirmed with bedside nurse the patient's most recent platelet count. Confirmed with patient that they are not currently taking any anticoagulation, have any bleeding history or any family history of bleeding disorders. Patient expressed understanding and wished to proceed. All questions were answered. Sterile technique was used throughout the entire procedure. Please see nursing notes for vital signs. Test dose was given through epidural needle and negative prior to continuing to dose epidural or start infusion. Warning signs of high block given to the patient including shortness of breath, tingling/numbness in hands, complete motor block, or any concerning symptoms with instructions to call for help. Patient was given  instructions on fall risk and not to get out of bed. All questions and concerns addressed with instructions to call with any issues.   

## 2015-08-14 NOTE — Progress Notes (Signed)
Utilization review completed.  L. J. Rodriguez Aguinaldo RN, BSN, CM 

## 2015-08-15 LAB — CBC
HEMATOCRIT: 31.5 % — AB (ref 36.0–46.0)
Hemoglobin: 11 g/dL — ABNORMAL LOW (ref 12.0–15.0)
MCH: 31.3 pg (ref 26.0–34.0)
MCHC: 34.9 g/dL (ref 30.0–36.0)
MCV: 89.5 fL (ref 78.0–100.0)
PLATELETS: 214 10*3/uL (ref 150–400)
RBC: 3.52 MIL/uL — ABNORMAL LOW (ref 3.87–5.11)
RDW: 14 % (ref 11.5–15.5)
WBC: 12.6 10*3/uL — ABNORMAL HIGH (ref 4.0–10.5)

## 2015-08-15 NOTE — Progress Notes (Signed)
POSTPARTUM PROGRESS NOTE  Post Partum Day 1 Subjective:  Tanya [name] is a 25 y.o. [id number] 6871w6d s/p VBAC.  No acute events overnight.  Pt denies problems with ambulating, voiding or po intake.  She denies nausea or vomiting.  Pain is well controlled.  She has had flatus. She has had bowel movement.  Lochia Minimal.   Objective: Blood pressure 97/53, pulse 73, temperature 99.2 F (37.3 C), temperature source Oral, resp. rate 18, height 5\' 1"  (1.549 m), weight 242 lb (109.77 kg), last menstrual period 11/15/2014, SpO2 98 %, unknown if currently breastfeeding.  Physical Exam:  General: alert, cooperative and no distress Lochia:normal flow Chest: CTAB Heart: RRR no m/r/g Abdomen: +BS, soft, nontender,  Uterine Fundus: firm, 2 below umbilicus DVT Evaluation: No calf swelling or tenderness Extremities: No edema   Recent Labs  08/14/15 0320 08/15/15 0548  HGB 12.2 11.0*  HCT 33.7* 31.5*    Assessment/Plan:  ASSESSMENT: Tanya MuffLatisha [name] is a 25 y.o. [id number] 871w6d s/p VBAC.  Plan for discharge tomorrow, Breastfeeding and Contraception Nexplanon   LOS: 1 day   Olena LeatherwoodKelly M Aguilar 08/15/2015, 7:45 AM

## 2015-08-15 NOTE — Lactation Note (Signed)
This note was copied from a baby's chart. Lactation Consultation Note Attempted consultation but baby had just eaten. Mother to call for assist at the next feeding. Patient Name: Tanya Charmian MuffLatisha Colbaugh OZHYQ'MToday's Date: 08/15/2015     Maternal Data    Feeding Feeding Type: Formula Length of feed: 15 min  LATCH Score/Interventions                      Lactation Tools Discussed/Used     Consult Status      Soyla DryerJoseph, Grey Schlauch 08/15/2015, 3:10 PM

## 2015-08-15 NOTE — Progress Notes (Signed)
Mother is motivated to breastfeed. She did not breast feed her other child. Baby is tongue thrusting and unable to maintain the latch after 2-3 sucks. She bites/gums on RN's gloved finger when assessing the suck. RN on nights initiated and assisted mother with #20 nipple shield at the breast for feedings. At this feeding, baby is eager to breastfeed, she is rooting, re-latching, sucking vigorously but unable maintaining the seal to sustain the latch. Smacking noted. Nipple shield applied and mother was assisted. Baby latches better with the shield for brief periods but the latch is shallow on the shield with pinched lips. Mother is also supplementing with small amounts of formula. Will contact Lactation to consult. 

## 2015-08-15 NOTE — Lactation Note (Signed)
This note was copied from a baby's chart. Lactation Consultation Note  Patient Name: Tanya Charmian MuffLatisha Rybka ZOXWR'UToday's Date: 08/15/2015 Reason for consult: Initial assessment;Difficult latch  Baby 36 hours old. Mom reports that baby is tongue-suckling and latching is difficult. Mom has attempted to nurse and then supplemented baby within the last hour. Baby sleeping now, so enc mom to call for assist with next latch. Discussed suck-training with mom, especially just before latching the baby. Mom has DEBP in room and states that she only obtained drops earlier. Discussed progression of milk coming to volume and enc hand expressing after pumping. Mom states that she remembers her milk coming to volume with first child even though she did not nurse that child. [facility], aware of OP/BFSG and LC phone line assistance after D/C.  Maternal Data    Feeding Feeding Type: Breast Fed Length of feed: 15 min  LATCH Score/Interventions Latch: Repeated attempts needed to sustain latch, nipple held in mouth throughout feeding, stimulation needed to elicit sucking reflex. Intervention(s): Adjust position;Assist with latch;Breast compression  Audible Swallowing: A few with stimulation  Type of Nipple: Flat (erect with stimulation)  Comfort (Breast/Nipple): Soft / non-tender     Hold (Positioning): Assistance needed to correctly position infant at breast and maintain latch. Intervention(s):  (#20 NS applied)  LATCH Score: 6  Lactation Tools Discussed/Used Tools: Pump Breast pump type: Double-Electric Breast Pump Pump Review: Setup, frequency, and cleaning;Milk Storage Initiated by:: Bedside RN Date initiated:: 08/15/15   Consult Status Consult Status: Follow-up Date: 08/16/15 Follow-up type: In-patient    Geralynn OchsWILLIARD, Deshondra Worst 08/15/2015, 10:53 PM

## 2015-08-16 MED ORDER — IBUPROFEN 600 MG PO TABS: 600.0000 mg | ORAL_TABLET | Freq: Four times a day (QID) | ORAL | Status: AC

## 2015-08-16 NOTE — Discharge Summary (Signed)
       OB Discharge Summary  Patient Name: Tanya [name] DOB: 04/02/1990 MRN: [id number]  Date of admission: 08/14/2015 Delivering MD: Donette LarryBHAMBRI, MELANIE   Date of discharge: 08/16/2015  Admitting diagnosis: 39 WEEKS HAVING CONTRACTIONS Intrauterine pregnancy: 6324w6d     Secondary diagnosis:Active Problems:   PROM (premature rupture of membranes)  Additional problems:none     Discharge diagnosis: Term Pregnancy Delivered                                                                     Post partum procedures:none  Augmentation: none  Complications: None  Hospital course:  Onset of Labor With Vaginal Delivery     25 y.o. yo [id number] at 224w6d was admitted in Active Labor on 08/14/2015. Patient had an uncomplicated labor course as follows:  Membrane Rupture Time/Date: 10:00 PM ,08/13/2015   Intrapartum Procedures: Episiotomy: None [1]                                         Lacerations:  Periurethral [8]  Patient had a delivery of a Viable infant. 08/14/2015  Information for the patient's newborn:  Hewitt BladeDixon, Girl Becki [[id number]][[id number]]  Delivery Method: VBAC, Spontaneous (Filed from Delivery Summary)    Pateint had an uncomplicated postpartum course.  She is ambulating, tolerating a regular diet, passing flatus, and urinating well. Patient is discharged home in stable condition on 08/16/2015.    Physical exam  Filed Vitals:   08/15/15 0100 08/15/15 0500 08/15/15 1817 08/16/15 0623  BP: 97/49 97/53 104/53 114/81  Pulse: 65 73 77 78  Temp: 99.5 F (37.5 C) 99.2 F (37.3 C) 98.6 F (37 C) 97.7 F (36.5 C)  TempSrc: Oral Oral Oral   Resp: 18 18 14 18   Height:      Weight:      SpO2:   99%    General: alert, cooperative and no distress Lochia: appropriate Uterine Fundus: firm Incision: N/A DVT Evaluation: Negative Homan's sign. No cords or calf tenderness. No significant calf/ankle edema. Labs: Lab Results  Component Value Date   WBC 12.6* 08/15/2015   HGB 11.0* 08/15/2015    HCT 31.5* 08/15/2015   MCV 89.5 08/15/2015   PLT 214 08/15/2015   No flowsheet data found.  Discharge instruction: per After Visit Summary and "Baby and Me Booklet".  After Visit Meds:    Medication List    ASK your doctor about these medications        prenatal multivitamin Tabs tablet  Take 1 tablet by mouth daily at 12 noon.        Diet: routine diet  Activity: Advance as tolerated. Pelvic rest for 6 weeks.   Outpatient follow up:6 weeks Follow up Appt:Future Appointments Date Time Provider Department Center  08/19/2015 8:05 AM Marlis EdelsonWalidah N Karim, CNM [facility]   Follow up visit: No Follow-up on file.  Postpartum contraception: Nexplanon  Newborn Data: Live born female  Birth Weight: 7 lb 3 oz (3260 g) APGAR: 9, 9  Baby Feeding: Bottle Disposition:home with mother   08/16/2015 Wyvonnia DuskyMarie Lawson, CNM

## 2015-08-16 NOTE — Progress Notes (Signed)
CSW received consult for [facility] as MOB started care at 30 weeks pregnancy. Pt d/c'd before CSW could see with no notice from hospital staff. CSW will follow up on drug screen results on baby as per [facility] protocol.  Grier Delwin Raczkowski, LCSW Clinical Social Worker   

## 2015-08-19 ENCOUNTER — Encounter: Payer: Self-pay | Admitting: Family

## 2015-09-21 ENCOUNTER — Encounter: Payer: Self-pay | Admitting: Family Medicine

## 2015-10-12 ENCOUNTER — Ambulatory Visit (INDEPENDENT_AMBULATORY_CARE_PROVIDER_SITE_OTHER): Payer: Medicaid Other | Admitting: Medical

## 2015-10-12 ENCOUNTER — Encounter: Payer: Self-pay | Admitting: Advanced Practice Midwife

## 2015-10-12 DIAGNOSIS — Z3202 Encounter for pregnancy test, result negative: Secondary | ICD-10-CM

## 2015-10-12 DIAGNOSIS — Z3049 Encounter for surveillance of other contraceptives: Secondary | ICD-10-CM | POA: Diagnosis not present

## 2015-10-12 DIAGNOSIS — Z30017 Encounter for initial prescription of implantable subdermal contraceptive: Secondary | ICD-10-CM

## 2015-10-12 LAB — POCT PREGNANCY, URINE: PREG TEST UR: NEGATIVE

## 2015-10-12 MED ORDER — ETONOGESTREL 68 MG ~~LOC~~ IMPL
68.0000 mg | DRUG_IMPLANT | Freq: Once | SUBCUTANEOUS | Status: AC
  Administered 2015-10-12: 68 mg via SUBCUTANEOUS

## 2015-10-12 NOTE — Progress Notes (Signed)
Subjective:     Tanya [name] is a 25 y.o. female who presents for a postpartum visit. She is 8 weeks postpartum following a spontaneous vaginal delivery. I have fully reviewed the prenatal and intrapartum course. The delivery was at 38.6 gestational weeks. Outcome: vaginal birth after cesarean (VBAC). Anesthesia: epidural. Postpartum course has been normal. Baby's course has been normal. Baby is feeding by bottle - Similac Advance. Bleeding no bleeding. Bowel function is normal. Bladder function is normal. Patient is not sexually active. Contraception method is none. Postpartum depression screening: negative.  The following portions of the patient's history were reviewed and updated as appropriate: allergies, current medications, past family history, past medical history, past social history, past surgical history and problem list.  Review of Systems Pertinent items are noted in HPI.   Objective:    BP 107/63   Pulse 79   Wt 216 lb 4.8 oz (98.1 kg)   LMP 10/03/2015 (Exact Date)   Breastfeeding? No   BMI 40.87 kg/m   General:  alert and cooperative   Breasts:  not evaluated  Lungs: clear to auscultation bilaterally  Heart:  regular rate and rhythm, S1, S2 normal, no murmur, click, rub or gallop  Abdomen: soft, non-tender; bowel sounds normal; no masses,  no organomegaly   Vulva:  normal  Vagina: not evaluated  Cervix:  not evaluated  Corpus: not examined  Adnexa:  not evaluated  Rectal Exam: Not performed.         GYNECOLOGY CLINIC PROCEDURE NOTE  Nexplanon Insertion Procedure Patient was given informed consent, she signed consent form.  Patient does understand that irregular bleeding is a very common side effect of this medication. She was advised to have backup contraception for one week after placement. Pregnancy test in clinic today was negative.  Appropriate time out taken.  Patient's left arm was prepped and draped in the usual sterile fashion.. The ruler used to measure and  mark insertion area.  Patient was prepped with alcohol swab and then injected with 3 ml of 1% lidocaine.  She was prepped with betadine, Nexplanon removed from packaging,  Device confirmed in needle, then inserted full length of needle and withdrawn per handbook instructions. Nexplanon was able to palpated in the patient's arm; patient palpated the insert herself. There was minimal blood loss.  Patient insertion site covered with guaze and a pressure bandage to reduce any bruising.  The patient tolerated the procedure well and was given post procedure instructions.   Assessment:     Normal postpartum exam. Pap smear not done at today's visit. Normal pap smear 2016.  Nexplanon insertion   Plan:    1. Contraception: Nexplanon 2. Follow up in: 1 year for annual exam or sooner as needed.    Marny LowensteinJulie N Shonda Mandarino, PA-C 10/12/2015 11:34 AM

## 2015-10-12 NOTE — Addendum Note (Signed)
Addended by: Faythe CasaBELLAMY, Jaleah Lefevre M on: 10/12/2015 11:59 AM   Modules accepted: Orders

## 2015-10-12 NOTE — Patient Instructions (Signed)
Etonogestrel implant What is this medicine? ETONOGESTREL (et oh noe JES trel) is a contraceptive (birth control) device. It is used to prevent pregnancy. It can be used for up to 3 years. This medicine may be used for other purposes; ask your health care provider or pharmacist if you have questions. What should I tell my health care provider before I take this medicine? They need to know if you have any of these conditions: -abnormal vaginal bleeding -blood vessel disease or blood clots -cancer of the breast, cervix, or liver -depression -diabetes -gallbladder disease -headaches -heart disease or recent heart attack -high blood pressure -high cholesterol -kidney disease -liver disease -renal disease -seizures -tobacco smoker -an unusual or allergic reaction to etonogestrel, other hormones, anesthetics or antiseptics, medicines, foods, dyes, or preservatives -pregnant or trying to get pregnant -breast-feeding How should I use this medicine? This device is inserted just under the skin on the inner side of your upper arm by a health care professional. Talk to your pediatrician regarding the use of this medicine in children. Special care may be needed. Overdosage: If you think you have taken too much of this medicine contact a poison control center or emergency room at once. NOTE: This medicine is only for you. Do not share this medicine with others. What if I miss a dose? This does not apply. What may interact with this medicine? Do not take this medicine with any of the following medications: -amprenavir -bosentan -fosamprenavir This medicine may also interact with the following medications: -barbiturate medicines for inducing sleep or treating seizures -certain medicines for fungal infections like ketoconazole and itraconazole -griseofulvin -medicines to treat seizures like carbamazepine, felbamate, oxcarbazepine, phenytoin,  topiramate -modafinil -phenylbutazone -rifampin -some medicines to treat HIV infection like atazanavir, indinavir, lopinavir, nelfinavir, tipranavir, ritonavir -St. John's wort This list may not describe all possible interactions. Give your health care provider a list of all the medicines, herbs, non-prescription drugs, or dietary supplements you use. Also tell them if you smoke, drink alcohol, or use illegal drugs. Some items may interact with your medicine. What should I watch for while using this medicine? This product does not protect you against HIV infection (AIDS) or other sexually transmitted diseases. You should be able to feel the implant by pressing your fingertips over the skin where it was inserted. Contact your doctor if you cannot feel the implant, and use a non-hormonal birth control method (such as condoms) until your doctor confirms that the implant is in place. If you feel that the implant may have broken or become bent while in your arm, contact your healthcare provider. What side effects may I notice from receiving this medicine? Side effects that you should report to your doctor or health care professional as soon as possible: -allergic reactions like skin rash, itching or hives, swelling of the face, lips, or tongue -breast lumps -changes in emotions or moods -depressed mood -heavy or prolonged menstrual bleeding -pain, irritation, swelling, or bruising at the insertion site -scar at site of insertion -signs of infection at the insertion site such as fever, and skin redness, pain or discharge -signs of pregnancy -signs and symptoms of a blood clot such as breathing problems; changes in vision; chest pain; severe, sudden headache; pain, swelling, warmth in the leg; trouble speaking; sudden numbness or weakness of the face, arm or leg -signs and symptoms of liver injury like dark yellow or brown urine; general ill feeling or flu-like symptoms; light-colored stools; loss of  appetite; nausea; right upper belly   pain; unusually weak or tired; yellowing of the eyes or skin -unusual vaginal bleeding, discharge -signs and symptoms of a stroke like changes in vision; confusion; trouble speaking or understanding; severe headaches; sudden numbness or weakness of the face, arm or leg; trouble walking; dizziness; loss of balance or coordination Side effects that usually do not require medical attention (Report these to your doctor or health care professional if they continue or are bothersome.): -acne -back pain -breast pain -changes in weight -dizziness -general ill feeling or flu-like symptoms -headache -irregular menstrual bleeding -nausea -sore throat -vaginal irritation or inflammation This list may not describe all possible side effects. Call your doctor for medical advice about side effects. You may report side effects to FDA at 1-800-FDA-1088. Where should I keep my medicine? This drug is given in a hospital or clinic and will not be stored at home. NOTE: This sheet is a summary. It may not cover all possible information. If you have questions about this medicine, talk to your doctor, pharmacist, or health care provider.    2016, [employer] Standard. (2013-11-21 14:07:06)  

## 2017-09-04 ENCOUNTER — Encounter (HOSPITAL_BASED_OUTPATIENT_CLINIC_OR_DEPARTMENT_OTHER): Payer: Self-pay | Admitting: *Deleted

## 2017-09-04 ENCOUNTER — Other Ambulatory Visit: Payer: Self-pay

## 2017-09-04 ENCOUNTER — Emergency Department (HOSPITAL_BASED_OUTPATIENT_CLINIC_OR_DEPARTMENT_OTHER)
Admission: EM | Admit: 2017-09-04 | Discharge: 2017-09-04 | Disposition: A | Discharge status: Home / Self Care | Payer: Self-pay | Attending: Emergency Medicine | Admitting: Emergency Medicine

## 2017-09-04 DIAGNOSIS — Z79899 Other long term (current) drug therapy: Secondary | ICD-10-CM | POA: Insufficient documentation

## 2017-09-04 DIAGNOSIS — K047 Periapical abscess without sinus: Secondary | ICD-10-CM | POA: Insufficient documentation

## 2017-09-04 DIAGNOSIS — K0889 Other specified disorders of teeth and supporting structures: Secondary | ICD-10-CM

## 2017-09-04 MED ORDER — BUPIVACAINE-EPINEPHRINE (PF) 0.5% -1:200000 IJ SOLN
1.8000 mL | Freq: Once | INTRAMUSCULAR | Status: AC
  Administered 2017-09-04: 1.8 mL
  Filled 2017-09-04: qty 1.8

## 2017-09-04 MED ORDER — PENICILLIN V POTASSIUM 250 MG PO TABS
500.0000 mg | ORAL_TABLET | Freq: Once | ORAL | Status: AC
  Administered 2017-09-04: 500 mg via ORAL
  Filled 2017-09-04: qty 2

## 2017-09-04 MED ORDER — PENICILLIN V POTASSIUM 500 MG PO TABS: 500.0000 mg | ORAL_TABLET | Freq: Four times a day (QID) | ORAL | 0 refills | Status: AC

## 2017-09-04 NOTE — ED Triage Notes (Signed)
Dental pain x 2 days

## 2017-09-04 NOTE — Discharge Instructions (Signed)
Your exam today is consistent with a dental infection.  We provided a dental block which improved her pain.  Please take the antibiotics to treat the likely infection and follow-up with a dentist as we discussed.  If any symptoms change or worsen or have difficulty swallowing or breathing, please return to the nearest emergency department.

## 2017-09-04 NOTE — ED Notes (Signed)
Signature pad not working in room 

## 2017-09-04 NOTE — ED Provider Notes (Signed)
[facility] EMERGENCY DEPARTMENT Provider Note   CSN: [id number] Arrival date & time: 09/04/17  1952     History   Chief Complaint Chief Complaint  Patient presents with  . Dental Pain    HPI Tanya [name] is a 27 y.o. female.  The history is provided by the patient and medical records.  Dental Pain   This is a new problem. The current episode started more than 2 days ago. The problem occurs constantly. The problem has not changed since onset.The pain is moderate. She has tried nothing for the symptoms. The treatment provided no relief.    Past Medical History:  Diagnosis Date  . Headache     Patient Active Problem List   Diagnosis Date Noted  . Previous cesarean delivery, delivered 06/17/2015    Past Surgical History:  Procedure Laterality Date  . CESAREAN SECTION       OB History    Gravida  2   Para  2   Term  2   Preterm      AB      Living  2     SAB      TAB      Ectopic      Multiple  0   Live Births  2            Home Medications    Prior to Admission medications   Medication Sig Start Date End Date Taking? Authorizing Provider  ibuprofen (ADVIL,MOTRIN) 600 MG tablet Take 1 tablet (600 mg total) by mouth every 6 (six) hours. Patient not taking: Reported on 10/12/2015 08/16/15   Montez Morita, CNM  Prenatal Vit-Fe Fumarate-FA (PRENATAL MULTIVITAMIN) TABS tablet Take 1 tablet by mouth daily at 12 noon.    [provider]    Family History Family History  Problem Relation Age of Onset  . Hypertension Maternal Grandmother     Social History Social History   Tobacco Use  . Smoking status: Never Smoker  . Smokeless tobacco: Never Used  Substance Use Topics  . Alcohol use: Yes    Comment: prior to finging out pregnancy  . Drug use: No     Allergies   Patient has no known allergies.   Review of Systems Review of Systems  Constitutional: Negative for chills, fatigue and fever.  HENT: Positive for  dental problem. Negative for ear pain, facial swelling, tinnitus, trouble swallowing and voice change.   Eyes: Negative for photophobia and visual disturbance.  Respiratory: Negative for chest tightness and shortness of breath.   Cardiovascular: Negative for chest pain.  Gastrointestinal: Negative for abdominal pain.  Genitourinary: Negative for dysuria.  Musculoskeletal: Negative for back pain, neck pain and neck stiffness.  Neurological: Negative for headaches.  Psychiatric/Behavioral: Negative for agitation.  All other systems reviewed and are negative.    Physical Exam Updated Vital Signs BP 110/68   Pulse 77   Temp 98.7 F (37.1 C) (Oral)   Resp 16   Ht 5\' 2"  (1.575 m)   Wt 98 kg (216 lb)   LMP 08/15/2017   SpO2 99%   BMI 39.51 kg/m   Physical Exam  Constitutional: She appears well-developed and well-nourished. No distress.  HENT:  Head: Normocephalic and atraumatic.  Mouth/Throat: Uvula is midline and oropharynx is clear and moist. No oral lesions. No trismus in the jaw. Abnormal dentition. Dental caries present. No uvula swelling. No oropharyngeal exudate, posterior oropharyngeal edema, posterior oropharyngeal erythema or tonsillar abscesses.  Tenderness present in right lower jaw.  Suspect dental abscess however no swelling or erythema was seen.  No purulence draining.  No evidence of Ludwig's, RPA, or PTA.  Eyes: Pupils are equal, round, and reactive to light. Conjunctivae and EOM are normal.  Neck: Normal range of motion. Neck supple.  Cardiovascular: Normal rate and regular rhythm.  No murmur heard. Pulmonary/Chest: Effort normal and breath sounds normal. No respiratory distress. She has no wheezes. She exhibits no tenderness.  Abdominal: Soft. There is no tenderness.  Musculoskeletal: She exhibits no edema or tenderness.  Lymphadenopathy:    She has no cervical adenopathy.  Neurological: She is alert.  Skin: Skin is warm and dry. Capillary refill takes  less than 2 seconds. No rash noted. She is not diaphoretic. No erythema.  Psychiatric: She has a normal mood and affect.  Nursing note and vitals reviewed.    ED Treatments / Results  Labs (all labs ordered are listed, but only abnormal results are displayed) Labs Reviewed - No data to display  EKG None  Radiology No results found.  Procedures Dental Block Date/Time: 09/04/2017 11:18 PM Performed by: Heide Scalesegeler, Whitni Pasquini J, MD Authorized by: Heide Scalesegeler, Miho Monda J, MD   Consent:    Consent obtained:  Verbal   Consent given by:  Patient   Risks discussed:  Pain, infection, unsuccessful block and swelling   Alternatives discussed:  No treatment Indications:    Indications: dental abscess and dental pain   Location:    Block type:  Inferior alveolar   Laterality:  Right Procedure details (see MAR for exact dosages):    Syringe type:  Aspirating dental syringe   Needle gauge:  25 G   Anesthetic injected:  Bupivacaine 0.5% WITH epi   Injection procedure:  Anatomic landmarks identified, introduced needle, incremental injection, anatomic landmarks palpated and negative aspiration for blood Post-procedure details:    Outcome:  Pain improved   Patient tolerance of procedure:  Tolerated well, no immediate complications   (including critical care time)  Medications Ordered in ED Medications  bupivacaine-epinephrine (MARCAINE W/ EPI) 0.5% -1:200000 injection 1.8 mL (1.8 mLs Infiltration Given by Other 09/04/17 2121)  penicillin v potassium (VEETID) tablet 500 mg (500 mg Oral Given 09/04/17 2043)     Initial Impression / Assessment and Plan / ED Course  I have reviewed the triage vital signs and the nursing notes.  Pertinent labs & imaging results that were available during my care of the patient were reviewed by me and considered in my medical decision making (see chart for details).     Tanya [name] is a 27 y.o. female with a past medical history significant for dental  infection who presents with right lower dental pain.  Patient reports that over the last several days she is developed pain in her right lower jaw.  She denies difficulty swallowing or breathing and says this feels similar to prior dental infection requiring antibiotics.  She has not seen a dentist in a prolonged period of time.  She denies fevers, chills, neck pain or neck stiffness.  She denies any injuries.  She denies other complaints.  On exam, patient has tenderness with palpation of the right gumline and right lower jaw.  Patient has dental caries.  No bulge or gum erythema was seen however clinically I am concerned about dental infection.  Patient had no evidence of Ludwig's, PTA or RPA.  Normal neck range of motion.  No stridor appreciated.  Clear speech.  Patient was offered a  dental block which she wanted to try.  Patient was blocked successfully with improved pain.  Patient also was given dose of penicillin.  Patient will need antibiotics and follow-up with dentistry.  Patient understood return precautions and follow-up instructions.  Patient no other questions or concerns and was discharged in good condition.   Final Clinical Impressions(s) / ED Diagnoses   Final diagnoses:  Pain, dental  Dental infection    ED Discharge Orders        Ordered    penicillin v potassium (VEETID) 500 MG tablet  4 times daily     09/04/17 2112     Clinical Impression: 1. Pain, dental   2. Dental infection     Disposition: Discharge  Condition: Good  I have discussed the results, Dx and Tx plan with the pt(& family if present). He/she/they expressed understanding and agree(s) with the plan. Discharge instructions discussed at great length. Strict return precautions discussed and pt &/or family have verbalized understanding of the instructions. No further questions at time of discharge.    Discharge Medication List as of 09/04/2017  9:16 PM    START taking these medications   Details    penicillin v potassium (VEETID) 500 MG tablet Take 1 tablet (500 mg total) by mouth 4 (four) times daily for 7 days., Starting Tue 09/04/2017, Until Tue 09/11/2017, Print        Follow Up: No follow-up provider specified.    Korah Hufstedler, Canary Brim, MD 09/04/17 [phone]

## 2018-10-24 ENCOUNTER — Telehealth: Payer: Self-pay | Admitting: Medical

## 2018-10-24 NOTE — Telephone Encounter (Signed)
Spoke to patient about her appointment on 9/4 @ 10:35. Patient instructed to wear a face mask for the entire appointment and no visitors are allowed with her during the visit. Patient screened for covid symptoms and denied having any

## 2018-10-25 ENCOUNTER — Other Ambulatory Visit: Payer: Self-pay

## 2018-10-25 ENCOUNTER — Ambulatory Visit (INDEPENDENT_AMBULATORY_CARE_PROVIDER_SITE_OTHER): Payer: Medicaid Other | Admitting: Medical

## 2018-10-25 ENCOUNTER — Other Ambulatory Visit (HOSPITAL_COMMUNITY)
Admission: RE | Admit: 2018-10-25 | Discharge: 2018-10-25 | Disposition: A | Discharge status: Home / Self Care | Payer: Medicaid Other | Source: Ambulatory Visit | Attending: Medical | Admitting: Medical

## 2018-10-25 VITALS — Wt 230.0 lb

## 2018-10-25 DIAGNOSIS — Z3046 Encounter for surveillance of implantable subdermal contraceptive: Secondary | ICD-10-CM

## 2018-10-25 DIAGNOSIS — Z30017 Encounter for initial prescription of implantable subdermal contraceptive: Secondary | ICD-10-CM

## 2018-10-25 DIAGNOSIS — Z01419 Encounter for gynecological examination (general) (routine) without abnormal findings: Secondary | ICD-10-CM | POA: Diagnosis not present

## 2018-10-25 DIAGNOSIS — Z124 Encounter for screening for malignant neoplasm of cervix: Secondary | ICD-10-CM | POA: Diagnosis not present

## 2018-10-25 LAB — POCT PREGNANCY, URINE: Preg Test, Ur: NEGATIVE

## 2018-10-25 MED ORDER — ETONOGESTREL 68 MG ~~LOC~~ IMPL
68.0000 mg | DRUG_IMPLANT | Freq: Once | SUBCUTANEOUS | Status: AC
  Administered 2018-10-25: 68 mg via SUBCUTANEOUS

## 2018-10-25 NOTE — Patient Instructions (Signed)
Etonogestrel implant What is this medicine? ETONOGESTREL (et oh noe JES trel) is a contraceptive (birth control) device. It is used to prevent pregnancy. It can be used for up to 3 years. This medicine may be used for other purposes; ask your health care provider or pharmacist if you have questions. COMMON BRAND NAME(S): Implanon, Nexplanon What should I tell my health care provider before I take this medicine? They need to know if you have any of these conditions:  abnormal vaginal bleeding  blood vessel disease or blood clots  breast, cervical, endometrial, ovarian, liver, or uterine cancer  diabetes  gallbladder disease  heart disease or recent heart attack  high blood pressure  high cholesterol or triglycerides  kidney disease  liver disease  migraine headaches  seizures  stroke  tobacco smoker  an unusual or allergic reaction to etonogestrel, anesthetics or antiseptics, other medicines, foods, dyes, or preservatives  pregnant or trying to get pregnant  breast-feeding How should I use this medicine? This device is inserted just under the skin on the inner side of your upper arm by a health care professional. Talk to your pediatrician regarding the use of this medicine in children. Special care may be needed. Overdosage: If you think you have taken too much of this medicine contact a poison control center or emergency room at once. NOTE: This medicine is only for you. Do not share this medicine with others. What if I miss a dose? This does not apply. What may interact with this medicine? Do not take this medicine with any of the following medications:  amprenavir  fosamprenavir This medicine may also interact with the following medications:  acitretin  aprepitant  armodafinil  bexarotene  bosentan  carbamazepine  certain medicines for fungal infections like fluconazole, ketoconazole, itraconazole and voriconazole  certain medicines to treat  hepatitis, HIV or AIDS  cyclosporine  felbamate  griseofulvin  lamotrigine  modafinil  oxcarbazepine  phenobarbital  phenytoin  primidone  rifabutin  rifampin  rifapentine  St. John's wort  topiramate This list may not describe all possible interactions. Give your health care provider a list of all the medicines, herbs, non-prescription drugs, or dietary supplements you use. Also tell them if you smoke, drink alcohol, or use illegal drugs. Some items may interact with your medicine. What should I watch for while using this medicine? This product does not protect you against HIV infection (AIDS) or other sexually transmitted diseases. You should be able to feel the implant by pressing your fingertips over the skin where it was inserted. Contact your doctor if you cannot feel the implant, and use a non-hormonal birth control method (such as condoms) until your doctor confirms that the implant is in place. Contact your doctor if you think that the implant may have broken or become bent while in your arm. You will receive a user card from your health care provider after the implant is inserted. The card is a record of the location of the implant in your upper arm and when it should be removed. Keep this card with your health records. What side effects may I notice from receiving this medicine? Side effects that you should report to your doctor or health care professional as soon as possible:  allergic reactions like skin rash, itching or hives, swelling of the face, lips, or tongue  breast lumps, breast tissue changes, or discharge  breathing problems  changes in emotions or moods  if you feel that the implant may have broken or   bent while in your arm  high blood pressure  pain, irritation, swelling, or bruising at the insertion site  scar at site of insertion  signs of infection at the insertion site such as fever, and skin redness, pain or discharge  signs and  symptoms of a blood clot such as breathing problems; changes in vision; chest pain; severe, sudden headache; pain, swelling, warmth in the leg; trouble speaking; sudden numbness or weakness of the face, arm or leg  signs and symptoms of liver injury like dark yellow or brown urine; general ill feeling or flu-like symptoms; light-colored stools; loss of appetite; nausea; right upper belly pain; unusually weak or tired; yellowing of the eyes or skin  unusual vaginal bleeding, discharge Side effects that usually do not require medical attention (report to your doctor or health care professional if they continue or are bothersome):  acne  breast pain or tenderness  headache  irregular menstrual bleeding  nausea This list may not describe all possible side effects. Call your doctor for medical advice about side effects. You may report side effects to FDA at 1-800-FDA-1088. Where should I keep my medicine? This drug is given in a hospital or clinic and will not be stored at home. NOTE: This sheet is a summary. It may not cover all possible information. If you have questions about this medicine, talk to your doctor, pharmacist, or health care provider.  2020 Elsevier/Gold Standard (2016-12-26 14:11:42) Nexplanon Instructions After Insertion   Keep bandage clean and dry for 24 hours   May use ice/Tylenol/Ibuprofen for soreness or pain   If you develop fever, drainage or increased warmth from incision site-contact office immediately   

## 2018-10-25 NOTE — Progress Notes (Signed)
  History:  Ms. Quenesha Douglass is a 28 y.o. [phone] here for Nexplanon removal and Nexplanon insertion. No GYN concerns.  Last pap smear was on 2016 and was normal. Will obtain pap smear today. No other gynecologic concerns.  The following portions of the patient's history were reviewed and updated as appropriate: allergies, current medications, family history, past medical history, social history, past surgical history and problem list.  Review of Systems:  Review of Systems  Constitutional: Negative for fever.  Gastrointestinal: Negative for abdominal pain.  Genitourinary:       Neg - vaginal bleeding, discharge      Objective:  Physical Exam Wt 230 lb (104.3 kg)   BMI 42.07 kg/m  Physical Exam  Nursing note and vitals reviewed. Constitutional: She is oriented to person, place, and time. She appears well-developed and well-nourished. No distress.  HENT:  Head: Normocephalic and atraumatic.  Cardiovascular: Normal rate.  Respiratory: Effort normal.  GI: Soft. She exhibits no distension.  Genitourinary: Cervix exhibits no motion tenderness, no discharge and no friability.    Vaginal discharge (small, white) present.     No vaginal bleeding.  No bleeding in the vagina.  Neurological: She is alert and oriented to person, place, and time.  Skin: Skin is warm and dry. No erythema.  Psychiatric: She has a normal mood and affect.    Labs and Imaging Results for orders placed or performed in visit on 10/25/18 (from the past 24 hour(s))  Pregnancy, urine POC     Status: None   Collection Time: 10/25/18 11:26 AM  Result Value Ref Range   Preg Test, Ur NEGATIVE NEGATIVE   Nexplanon Removal and Insertion  Patient was given informed consent for removal of her Implanon and insertion of Nexplanon.  Patient does understand that irregular bleeding is a very common side effect of this medication. She was advised to have backup contraception for one week after replacement of the implant.  Pregnancy test in clinic today was negative.  Appropriate time out taken. Implanon site identified. Area prepped in usual sterile fashon. Three ml of 1% lidocaine was used to anesthetize the area at the distal end of the implant. A small stab incision was made right beside the implant on the distal portion. The Nexplanon rod was grasped using hemostats and removed without difficulty. There was minimal blood loss. There were no complications. Area was then injected with 3 ml of 1 % lidocaine. She was re-prepped. Nexplanon removed from packaging, Device confirmed in needle, then inserted full length of needle and withdrawn per handbook instructions. Nexplanon was able to palpated in the patient's arm; patient refused to palpate it herself.  There was minimal blood loss. Patient insertion site covered with guaze and a pressure bandage to reduce any bruising. The patient tolerated the procedure well and was given post procedure instructions.    Assessment & Plan:  1. Nexplanon removal  2. Nexplanon insertion - Advised to use a back-up method of birth control x 1 week since last Nexplanon was expired  3. Encounter for annual routine gynecological examination - Cytology - PAP( [facility])  Return to [facility] in 1 year for annual exam or sooner PRN   Danielle Rankin 10/25/2018 11:57 AM

## 2018-10-29 LAB — CYTOLOGY - PAP: Diagnosis: NEGATIVE

## 2023-02-28 ENCOUNTER — Ambulatory Visit: Payer: 59 | Admitting: Family Medicine

## 2023-02-28 DIAGNOSIS — B977 Papillomavirus as the cause of diseases classified elsewhere: Secondary | ICD-10-CM | POA: Insufficient documentation
